# Patient Record
Sex: Female | Born: 1965 | Race: White | Hispanic: No | Marital: Married | Smoking: Never smoker
Health system: Southern US, Community
[De-identification: ages and names within clinical notes are randomized; demographics above are authoritative.]

## PROBLEM LIST (undated history)

## (undated) DIAGNOSIS — K5792 Diverticulitis of intestine, part unspecified, without perforation or abscess without bleeding: Secondary | ICD-10-CM

## (undated) DIAGNOSIS — R51 Headache: Secondary | ICD-10-CM

## (undated) DIAGNOSIS — N6459 Other signs and symptoms in breast: Secondary | ICD-10-CM

## (undated) DIAGNOSIS — Z87898 Personal history of other specified conditions: Secondary | ICD-10-CM

## (undated) DIAGNOSIS — F988 Other specified behavioral and emotional disorders with onset usually occurring in childhood and adolescence: Secondary | ICD-10-CM

## (undated) DIAGNOSIS — R519 Headache, unspecified: Secondary | ICD-10-CM

## (undated) DIAGNOSIS — T4145XA Adverse effect of unspecified anesthetic, initial encounter: Secondary | ICD-10-CM

## (undated) DIAGNOSIS — R112 Nausea with vomiting, unspecified: Secondary | ICD-10-CM

## (undated) DIAGNOSIS — J309 Allergic rhinitis, unspecified: Secondary | ICD-10-CM

## (undated) DIAGNOSIS — Z9889 Other specified postprocedural states: Secondary | ICD-10-CM

## (undated) HISTORY — DX: Headache: R51

## (undated) HISTORY — DX: Personal history of other specified conditions: Z87.898

## (undated) HISTORY — DX: Other specified behavioral and emotional disorders with onset usually occurring in childhood and adolescence: F98.8

## (undated) HISTORY — DX: Allergic rhinitis, unspecified: J30.9

## (undated) HISTORY — PX: ABDOMINAL HYSTERECTOMY: SHX81

## (undated) HISTORY — DX: Other signs and symptoms in breast: N64.59

## (undated) HISTORY — DX: Diverticulitis of intestine, part unspecified, without perforation or abscess without bleeding: K57.92

## (undated) HISTORY — DX: Headache, unspecified: R51.9

## (undated) HISTORY — PX: TONSILLECTOMY: SUR1361

---

## 1995-04-21 DIAGNOSIS — T8859XA Other complications of anesthesia, initial encounter: Secondary | ICD-10-CM

## 1995-04-21 HISTORY — DX: Other complications of anesthesia, initial encounter: T88.59XA

## 2005-03-19 ENCOUNTER — Ambulatory Visit: Payer: Self-pay

## 2006-04-01 ENCOUNTER — Ambulatory Visit: Payer: Self-pay | Admitting: Nurse Practitioner

## 2007-05-25 ENCOUNTER — Ambulatory Visit: Payer: Self-pay

## 2007-08-25 ENCOUNTER — Encounter: Payer: Self-pay | Admitting: Obstetrics & Gynecology

## 2007-08-25 ENCOUNTER — Ambulatory Visit: Payer: Self-pay | Admitting: Obstetrics & Gynecology

## 2007-08-25 DIAGNOSIS — N6459 Other signs and symptoms in breast: Secondary | ICD-10-CM

## 2007-08-25 HISTORY — DX: Other signs and symptoms in breast: N64.59

## 2007-08-26 ENCOUNTER — Ambulatory Visit: Payer: Self-pay | Admitting: Obstetrics & Gynecology

## 2007-08-29 ENCOUNTER — Ambulatory Visit: Payer: Self-pay | Admitting: Obstetrics & Gynecology

## 2007-08-30 ENCOUNTER — Ambulatory Visit: Payer: Self-pay | Admitting: Obstetrics & Gynecology

## 2007-09-01 ENCOUNTER — Ambulatory Visit: Payer: Self-pay | Admitting: Nurse Practitioner

## 2008-08-28 ENCOUNTER — Ambulatory Visit: Payer: Self-pay | Admitting: Nurse Practitioner

## 2008-08-28 ENCOUNTER — Encounter: Payer: Self-pay | Admitting: Obstetrics & Gynecology

## 2008-09-11 ENCOUNTER — Ambulatory Visit: Payer: Self-pay | Admitting: Nurse Practitioner

## 2008-09-11 ENCOUNTER — Encounter: Payer: Self-pay | Admitting: Obstetrics & Gynecology

## 2008-09-20 ENCOUNTER — Ambulatory Visit: Payer: Self-pay | Admitting: Obstetrics & Gynecology

## 2009-06-04 ENCOUNTER — Ambulatory Visit: Payer: Self-pay | Admitting: Obstetrics and Gynecology

## 2009-09-03 ENCOUNTER — Ambulatory Visit: Payer: Self-pay | Admitting: Nurse Practitioner

## 2010-09-02 ENCOUNTER — Ambulatory Visit: Payer: Self-pay | Admitting: Nurse Practitioner

## 2010-09-02 NOTE — Assessment & Plan Note (Signed)
NAME:  Dawn Spencer NO.:  [id number]   MEDICAL RECORD NO.:  [id number]          PATIENT TYPE:  POB   LOCATION:  [facility] at [facility]         FACILITY:  [facility]   PHYSICIAN:  Catalina Antigua, MD     DATE OF BIRTH:  1965/05/27   DATE OF SERVICE:  06/04/2009                                  CLINIC NOTE   The patient comes to the office today for several issues, of first is to  follow up on some lab that she had done.  She works at [employer] and  they have access to [employer] in [place] and they are allowed to have  their labs done basically for free.  She had a complete metabolic panel  showing the glucose elevated at 91.  She also had a fasting lipid, total  cholesterol was 238 and her LDL was 155.  Other negatives were in her  urine, she had +3 blood.  She is not on her menstrual cycle.  All other  labs were normal including TSH, CBC.  Second issue is that of insomnia.  The patient continues to have difficulty with sleep ongoing for almost 1  year.  She has difficulty falling asleep taking 1-2 hours after taking  the Ambien to fall asleep, she also has difficulty maintaining sleep and  wakes up about 4:30 a.m.Marland Kitchen  She denies taking any daytime nap.  She does  have a sedentary lifestyle.  She does not exercise.  She complains of  daytime fatigue.  She does snore.  She does not have hypertension.  She  does not have apnea.  Patient is doing better.  The third issue is that  of migraine headaches.  The patient is doing better on Relpax, we  switched her from Imitrex which did not seem to be helping over to  Relpax as she feels that she is in much better place with her migraine  headaches.  She is also having ongoing issues with her weight.  She is 5  feet 1, weighs 186 pounds.  Her weight has fluctuated over years.  She  did recently lose down to 174 but has put weight back on again.  She has  also had problems with her cholesterol related to weight gain and at one  point, her cholesterol was in 300s.  This patient does not have a  primary care physician.   PHYSICAL EXAM:  GENERAL:  Well-developed, well-nourished 45 year old  Caucasian female in no acute distress.  VITAL SIGNS:  Blood pressure is 116/82, pulse 74, weight 186, height 5  feet 5.  CARDIAC:  Regular rate and rhythm.  LUNGS:  Clear bilaterally.   ASSESSMENT:  1. Migraine headaches.  2. Abnormal labs.  3. Insomnia.  4. Obesity.   PLAN:  1. We will send her for a sleep study.  2. She will be referred to [place] [facility] for primary care to      include evaluation and maintenance of her cholesterol.  We will      switch her over to Ambien CR 112.5 one p.o. q.h.s. #30 with 4      refills.  We will repeat  her urine if she continues to have blood in her urine.  She will be      referred to a urologist.  She will be returning here in May for her      annual Pap smear.      [name], NP    ______________________________  Catalina Antigua, MD    LR/MEDQ  D:  06/04/2009  T:  06/05/2009  Job:  [id number]

## 2010-09-02 NOTE — Assessment & Plan Note (Signed)
NAME:  Dawn Spencer.:  [id number]   MEDICAL RECORD NO.:  [id number]          PATIENT TYPE:  POB   LOCATION:  [facility]         FACILITY:  [facility]   PHYSICIAN:  Johnella Moloney, MD        DATE OF BIRTH:  Sep 04, 1965   DATE OF SERVICE:  08/28/2008                                  CLINIC NOTE   The patient comes to the office today for her yearly Pap, pelvic and  breast exam.  The patient has had no more issues with her left breast.  Please see that note from May of 2009, but in December of 2009 she did  find a lump in her right breast.  She does have a family history of  breast cancer including grandmother and two aunts.  She was concerned  and called the office.  We did order a mammogram and an ultrasound.  She  was diagnosed with a cyst in her breast, was offered the option of  aspiration, declined that and was asked to return in March of 2010.  As  yet she has not returned but she will be returning after today's visit.  She has been seen at [facility]  to get her  yearly labs done and her cholesterol has come down from the 280s down to  the 217 range.  Her triglycerides and other labs were improved from last  year.  Since then she is currently engaged.  She is not having any other  physical problems.   VITAL SIGNS:  Blood pressure is 120/85, pulse is 65, weight 188, height  is 5 feet 1 inch.   ALLERGIES:  No known drug allergies.   PHYSICAL EXAM:  Well-developed, well-nourished 45 year old Caucasian  female in no acute distress.  HEENT:  Head is normocephalic and atraumatic.  Thyroid is not enlarged.  There were no nodules present.  CARDIAC:  Regular rate and rhythm.  LUNGS:  Clear bilaterally.  BREASTS:  Large, equal in size.  On the right upper outer quadrant there  is an approximately 2-3 cm nontender mass.  The left breast is without  any mass.  There is no dimpling.  There is no nipple discharge.  ABDOMEN:  Obese,  nontender.  GENITALIA:  Externally there are no lesions or discharges.  Internally  the vaginal vault has a scant amount of white nonodorous discharge.  The  cervix is pinpoint and difficult to obtain a specimen.  In fact Dr. Silas Flood  was called in to help.  Bimanual exam:  No cervical motion tenderness.  No adnexal mass.  EXTREMITIES:  Warm and dry.   ASSESSMENT:  Yearly Pap, pelvic and breast exam.  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  The plan for  this is we will give her samples of Vagifem 10 mcg one tablet in the  vagina nightly for 14 nights.  Following that the patient will return to  the office and we will attempt to repeat her Pap smear.  If that is not  successful she will be scheduled with a physician  to use a dilator to  get an adequate sample.   PLAN:  1. Contraception.  The patient has been on [place] birth control pills.      These have worked very well for her.  She is a nonsmoker.  She will      have that refilled one p.o. daily #28 with 11 refills.  2. Health maintenance.  The patient will have her laboratory work      faxed to this office.  3. Right breast lump.  Plan:  The patient will return for her      mammogram as she was previously directed.  She continues to not be      in favor of aspiration.  She will again return to this office in 2      weeks.      [name], NP    ______________________________  Johnella Moloney, MD    LR/MEDQ  D:  08/28/2008  T:  08/28/2008  Job:  [id number]

## 2010-09-02 NOTE — Assessment & Plan Note (Signed)
NAME:  Dawn Spencer.:  [id number]   MEDICAL RECORD NO.:  [id number]          PATIENT TYPE:  POB   LOCATION:  [facility]         FACILITY:  [facility]   PHYSICIAN:  Johnella Moloney, MD        DATE OF BIRTH:  1965-06-18   DATE OF SERVICE:                                  CLINIC NOTE   HISTORY OF PRESENT ILLNESS:  The patient comes to the office today for  her yearly Pap, pelvic and breast exam.  She has two complaints today.  The first is that of abnormal bleeding from her left nipple that  happened approximately one week ago.  She originally thought that this  was blood from shaving her legs and then realized the blood was coming  from her nipple.  She held a towel over this and it stopped bleeding  after approximately 5 minutes.  She does describe this is a bright red  blood.  There was no pain prior to this and no pain or discharge  afterwards.  She did have her last mammogram in January 2009 at [facility].  She has had one yearly with no abnormal period.  She does have a maternal grandmother who died premenopausal with breast  cancer, otherwise, no family history.  Her second complaint today is  that of vaginal discharge that has an odor.  She describes it as clear  with an odorous component approximately two to three times per week.  She is also interested today in changing her birth control pills as she  feels that they are a low dosed.  They are not keeping her from having  heavy periods.   IMMUNIZATIONS:  The patient has had rubella tetanus and flu.   MENSTRUAL HISTORY:  The patient's first day of her last menstrual period  was August 16, 2007.  She did begin her menstrual cycles age of 26.  Her  periods last between 7-8 days.  She describes her menstrual cycle as  having.  She is currently using Ortho Tri-Cyclen daily for birth  control.  She is not attempting pregnancy.   OBSTETRICAL HISTORY:  She is G3, P3.  She had three C  sections her last  pregnancy was 12 years ago.   GYNECOLOGIC HISTORY:  Her last Pap smear was July 2006.  It was not  abnormal.  She has never had an abnormal.   SURGICAL HISTORY:  She has never had a blood transfusion.  Other than C-  section, she has not had any surgery.   FAMILY HISTORY:  Dawn Spencer has had heart attack, grandmother on her  mother's side did have a cancer.   PERSONAL MEDICAL HISTORY:  None.   SOCIAL HISTORY:  The patient works outside the home.  She does not  smoke.  She is a rare social drinker.  She partakes in very few  caffeinated beverages.  She has no risky sexual behavior.  She is  currently engaged to be married and is contemplating marriage next  summer.   REVIEW OF SYSTEMS:  The patient has had a 30-pound weight gain in the  past 6  months.  Prior to that, she had lost 70 pounds on Weight Watchers  and feels this weight is returning.  She does state that when she was on  Weight Watchers, she was only eating approximately 500 calories, and she  realizes she was under eating.  She also complains of a vaginal odor  with her discharge.   PHYSICAL EXAMINATION:  VITAL SIGNS:  Blood pressure is 126/90, the  patient does state that she is quite anxious today, pulse is 77, weight  is 187.  Height is 5 feet 1 inches.  HEENT:  Head is normocephalic and atraumatic.  Thyroid is without  nodules or enlargement.  CARDIAC:  Regular rate and rhythm.  LUNGS:  Clear bilaterally.  ABDOMEN:  Soft, nontender with no organomegaly.  BREASTS:  Symmetrical.  There is no axilla lymphadenopathy.  There is no  nipple discharge or bleeding noted.  No abrasions on the nipples.  There  is no mass appreciated.  She does have some general lumpiness to her  breasts, but no specific mass or tenderness noted.  GENITALIA:  Externally, the patient has a shaved mons.  Internally, the  vaginal vault is clear of discharge.  She does not have any odor.  The  cervix is retroflexed and  retroverted somewhat difficult to get a Pap  smear.  She did have some easy bleeding.  Bimanual exam:  There was no  cervical motion tenderness.  There is no adnexal mass.   ASSESSMENT/PLAN:  1. Well-woman exam.  We will do a wet prep, GC, chlamydia as well as      Pap smear today.  2. Bleeding from her left nipple.  We will order a diagnostic      mammogram as well as a diagnostic ultrasound.  We will check her      labs for CBC, Chem 20, TSH and prolactin level.  She will return      when she is fasting for a fasting cholesterol check.  We will see      her back in one week for follow-up on her test as well as labs.      [name], NP    ______________________________  Johnella Moloney, MD    LR/MEDQ  D:  08/25/2007  T:  08/25/2007  Job:  [id number]

## 2010-09-02 NOTE — Assessment & Plan Note (Signed)
NAME:  Dawn Spencer.:  [id number]   MEDICAL RECORD NO.:  [id number]          PATIENT TYPE:  POB   LOCATION:  [facility]         FACILITY:  [facility]   PHYSICIAN:  Ginger Carne, MD DATE OF BIRTH:  05-09-1965   DATE OF SERVICE:  09/01/2007                                  CLINIC NOTE   The patient comes to the office today following up on the bleeding from  her left breast.  We did get all of her mammograms, ultrasounds and labs  back, all were normal.  She also had a DEXA scan done which was normal.  She has a mother with severe osteoporosis and she was anxious to have  that done.  She also is today interested in switching over her birth  control from the monthly to the 3 month [place].   Vital signs; blood pressure is 121/77, pulse is 70.  Weight is 188.  Height is 5 feet 1.   ASSESSMENT/PLAN:  1. Breast bleeding.  I did discuss this with Dr. Mia Creek, and he      suggested that unless she has any other bleeding problems that we      see her back for her yearly visit.  2. Contraception.  Will switch her from her birth control to      [place].  She will take 1 tablet daily.  We did spend time      discussing how this was to be taken and that she may have some      breakthrough bleeding at week 6, and she is to stop her birth      control pills for 2-3 days during that time, allow herself to have      a small period and then resume those.  3. She will return to clinic in 1 year or p.r.n. if she has any need      for this.      [name], NP    ______________________________  Ginger Carne, MD    LR/MEDQ  D:  09/01/2007  T:  09/01/2007  Job:  [id number]

## 2010-09-02 NOTE — Assessment & Plan Note (Signed)
NAME:  Dawn Spencer.:  [id number]   MEDICAL RECORD NO.:  [id number]          PATIENT TYPE:  POB   LOCATION:  [facility]         FACILITY:  [facility]   PHYSICIAN:  [name], NP   DATE OF BIRTH:  [birth year]   DATE OF SERVICE:  09/03/2009                                  CLINIC NOTE   The patient comes to office today for several issues, the first is that  to discuss her birth control pill and her irregular menses.  She has  been having some breakthrough bleeding with her Garnette Scheuermann and she wants to  switch to something stronger.  She is currently engaged to be married.  There are no other issues.  The second issue is that of weight loss  since her last office visit on June 04, 2009.  She is showing weight  watchers, started exercising, and has lost 26 pounds.  She has not gone  to [facility] to have her cholesterol except for a recheck as  she has decided that she will wait until July.  Since she is losing  weight, she has also start taking fish oil, B12, calcium, and niacin in  the hopes of bringing her cholesterol down.  She is also taking red rice  yeast.  She was unable to afford Ambien apparently, it is going to be  approximately 75 dollars per month and that was cost negative for her.  She is willing to try something different.  She still has trouble  falling asleep and staying asleep.  She did not go for her sleep study.   ALLERGIES:  No known allergies.   PHYSICAL EXAMINATION:  VITAL SIGNS:  Blood pressure is 120/78, pulse 66,  weight 120.  GENERAL:  Well-developed, well-nourished, overweight 45 year old  Caucasian female in no acute distress.  HEENT:  Head is normocephalic and atraumatic.  Pupils equal and react.  EXTREMITIES:  Warm and dry.  She does have a reddish-colored mole on her  left lower leg.   ASSESSMENT:  1. Irregular menstrual cycle.  2. Insomnia.  3. Mole on left leg.   PLAN:  The patient will be given  samples of Silenor 6 mg 1 sample pack  with prescription for #30 with 11 refills.  We will also switch her  birth control over to Levlen 1 p.o. daily #28 with 11 refills.  The  patient is also scheduled for her mammogram and referred to Dermatology.  She will follow up for her yearly physical with either this office or  [facility] office, she is free to choose.      [name], NP     LR/MEDQ  D:  09/03/2009  T:  09/04/2009  Job:  [id number]

## 2010-09-02 NOTE — Assessment & Plan Note (Signed)
NAME:  Dawn Spencer.:  [id number]   MEDICAL RECORD NO.:  [id number]          PATIENT TYPE:  POB   LOCATION:  [facility]         FACILITY:  [facility]   PHYSICIAN:  Elsie Lincoln, MD      DATE OF BIRTH:  February 09, 1966   DATE OF SERVICE:                                  CLINIC NOTE   The patient comes into the office today for an unsatisfactory Pap smear.  At her last office visit from Aug 28, 2008, we were unable to get a  specimen secondary to the size of her cervical os.  After consultation  with Dr. Silas Flood, we gave the patient Vagifem 10 mcg 1 tablet into her  vagina nightly for the next 14 nights.  The patient did return today for  that repeat Pap smear.  The patient also has several other complaints  including vaginal dryness.  She has also been having some insomnia.  She  has no difficulty falling asleep but difficulty staying asleep.  She  awakens at night between 3 and 4 o'clock.  This has been ongoing for  about 6 weeks.  She cannot relate it to anything.  She denies any signs  or symptoms of depression.  She feels overall that she is very happy in  her life.  She is also having some worsening of her migraine headache.  She has had migraines for years.  She usually takes Excedrin Migraine  and does well.  She had one very bad headache last week in which she  felt so ill that she had to vomit.   PHYSICAL EXAMINATION:  GENERAL:  Well-developed, well-nourished 45-year-  old Caucasian female in no acute distress.  VITAL SIGNS:  Blood pressure is 126/94, weight 186 pounds, height 5 feet  1 inch, pulse 65.  GENITALIA:  Externally, the labia are flat, dry, and somewhat tacky.  Internally, the vaginal vault has a thickish, white, non-odorous  discharge.  The cervix continues to be very small, although the os  appears larger than it was.  We did attempt to get a specimen, and there  was some friability.   ASSESSMENT:  1. Repeat Pap smear due to inadequate  sampling.  The plan for this is      if this Pap smear result comes back unsatisfactory specimen, she      will be referred to the MD who will have to dilate her cervix for a      sample.  2. Contraception.  The patient is currently on Micronor for      contraception.  It is possible that she is on too low of a dose of      birth control pills, and we have discussed increasing her dose.  We      will start her on Femcon Fe 1 p.o. daily #28 with 3 refills.  She      is given one month's sample.  3. Insomnia.  The patient is having difficulty maintaining sleep.  We      did discuss sleep hygiene.  She will be given Ambien 10 mg 1/2      tablet  to 1 tablet at bedtime p.r.n. sleep #20 with 2 refills.  4. Migraine.  The patient is given a prescription for Imitrex 100 mg 1      p.o. p.r.n. migraine #9 with 3 refills.  She again will be seen      back by the MD for her Pap smear.      [name], NP    ______________________________  Elsie Lincoln, MD   LR/MEDQ  D:  09/11/2008  T:  09/11/2008  Job:  [id number]

## 2010-10-07 ENCOUNTER — Ambulatory Visit (INDEPENDENT_AMBULATORY_CARE_PROVIDER_SITE_OTHER): Payer: BC Managed Care – PPO | Admitting: Nurse Practitioner

## 2010-10-07 DIAGNOSIS — G43109 Migraine with aura, not intractable, without status migrainosus: Secondary | ICD-10-CM

## 2010-10-07 DIAGNOSIS — Z3009 Encounter for other general counseling and advice on contraception: Secondary | ICD-10-CM

## 2010-10-07 DIAGNOSIS — Z01419 Encounter for gynecological examination (general) (routine) without abnormal findings: Secondary | ICD-10-CM

## 2010-10-08 NOTE — Assessment & Plan Note (Unsigned)
NAME:  Dawn Spencer, Dawn Spencer NO.:  [id number]  MEDICAL RECORD NO.:  [id number]          PATIENT TYPE:  LOCATION:  [facility]           FACILITY:  PHYSICIAN:  Jaynie Collins, MD          DATE OF BIRTH:  DATE OF SERVICE:  10/07/2010                                 CLINIC NOTE  The patient comes to office today for her yearly Pap, pelvic, and breast exam.  The patient has been given samples of a low Lo Loestrin and done well on that and would like to have a refill on that.  She has also done well with her headaches on Relpax, somewhat like to stay on that.  She has gained some weight and is interested in discussing weight loss issues that she has had years of weight gain and weight loss.  Her chart is not available to [place] today.  PHYSICAL EXAMINATION:  VITAL SIGNS:  Blood pressure is 127/88, pulse is 89, weight 176, height is 61 inches. HEENT:  Head is normocephalic and atraumatic.  Pupil equal and reactive. Thyroid is without enlargement or nodules. CARDIAC:  Regular rate and rhythm.  No murmurs, rubs, or thrills. LUNGS:  Clear bilaterally. BREASTS:  Symmetrical.  No skin dimpling.  No mass appreciated. ABDOMEN:  Obese, nontender.  No organomegaly. GENITALIA:  Externally, the mons is shaved.  Internally, there is a thin white non-odorous vaginal discharge.  The os in the cervix is quite difficult to see, unable to collect adequate specimen.  At this point Dr. Macon Large, came in to evaluate the patient. EXTREMITIES:  Warm and dry.  ASSESSMENT: 1. Yearly Pap, pelvic. 2. Contraception. 3. Migraine.  PLAN:  The patient is given refills on her low Loestrin Fe.  She is given a 71-month supply with 4 refills.  She is also given prescription for Relpax 40 mg, a 41-month supply with 4 refills.  She is also on the advice of Dr. Macon Large, given Cytotec 200 mcg to place in her vagina the night before she returns to this office for repeat Pap smear.  She will return at  her convenience and hopefully at that point, we will have found her chart and can review her past medical history.    [name], NP   ______________________________ Jaynie Collins, MD  LR/MEDQ  D:  10/07/2010  T:  10/08/2010  Job:  [id number]

## 2010-10-28 ENCOUNTER — Ambulatory Visit: Payer: BC Managed Care – PPO | Admitting: Nurse Practitioner

## 2011-01-29 DIAGNOSIS — N6459 Other signs and symptoms in breast: Secondary | ICD-10-CM | POA: Insufficient documentation

## 2011-02-02 ENCOUNTER — Ambulatory Visit: Payer: BC Managed Care – PPO | Admitting: Obstetrics & Gynecology

## 2011-02-02 DIAGNOSIS — N920 Excessive and frequent menstruation with regular cycle: Secondary | ICD-10-CM

## 2011-06-02 ENCOUNTER — Encounter: Payer: Self-pay | Admitting: Nurse Practitioner

## 2011-06-02 ENCOUNTER — Ambulatory Visit (INDEPENDENT_AMBULATORY_CARE_PROVIDER_SITE_OTHER): Payer: BC Managed Care – PPO | Admitting: Nurse Practitioner

## 2011-06-02 DIAGNOSIS — E559 Vitamin D deficiency, unspecified: Secondary | ICD-10-CM

## 2011-06-02 DIAGNOSIS — Z1272 Encounter for screening for malignant neoplasm of vagina: Secondary | ICD-10-CM

## 2011-06-02 DIAGNOSIS — Z23 Encounter for immunization: Secondary | ICD-10-CM

## 2011-06-02 DIAGNOSIS — G43009 Migraine without aura, not intractable, without status migrainosus: Secondary | ICD-10-CM

## 2011-06-02 DIAGNOSIS — G4700 Insomnia, unspecified: Secondary | ICD-10-CM

## 2011-06-02 DIAGNOSIS — Z01419 Encounter for gynecological examination (general) (routine) without abnormal findings: Secondary | ICD-10-CM

## 2011-06-02 DIAGNOSIS — G43909 Migraine, unspecified, not intractable, without status migrainosus: Secondary | ICD-10-CM

## 2011-06-02 DIAGNOSIS — Z309 Encounter for contraceptive management, unspecified: Secondary | ICD-10-CM | POA: Insufficient documentation

## 2011-06-02 MED ORDER — TETANUS-DIPHTH-ACELL PERTUSSIS 5-2.5-18.5 LF-MCG/0.5 IM SUSP: 0.5000 mL | Freq: Once | INTRAMUSCULAR | Status: DC

## 2011-06-02 MED ORDER — CALCIUM CARBONATE-VITAMIN D 500-200 MG-UNIT PO TABS: 1.0000 | ORAL_TABLET | Freq: Two times a day (BID) | ORAL | Status: DC

## 2011-06-02 MED ORDER — ELETRIPTAN HYDROBROMIDE 40 MG PO TABS: 40.0000 mg | ORAL_TABLET | ORAL | Status: DC | PRN

## 2011-06-02 MED ORDER — ZOLPIDEM TARTRATE 10 MG PO TABS: 10.0000 mg | ORAL_TABLET | Freq: Every evening | ORAL | Status: DC | PRN

## 2011-06-02 NOTE — Patient Instructions (Signed)

## 2011-06-02 NOTE — Progress Notes (Signed)
Subjective:     Patient ID: Dawn Spencer, female   DOB: 04/07/1966, 46 y.o.   MRN: [id number]  HPI  In the office today for yearly exam. Last pap was 3 years ago and there is an issue with her stenotic cervix. She has been given cytotec in past. Her paps have been negative. She has been having some trouble with her low dose BCP Lo Loestrin FE and break through bleeding. She would like to be on a higher dose pill. She does have migraine without aura and need refill on Relpax. She is also having some issues with insomnia related to husband losing his job and his health concerns. She has lost 44 lbs on high protein diet   Review of Systems  Constitutional: Negative.  Negative for unexpected weight change.       Has lost 44 lbs on high protein diet  HENT: Negative.   Eyes: Negative.   Respiratory: Negative.   Cardiovascular: Negative.   Gastrointestinal: Negative.   Genitourinary: Negative.   Musculoskeletal: Negative.   Neurological:       Has migraine without aura  Psychiatric/Behavioral:       Has insomnia  All other systems reviewed and are negative.       Objective:   Physical Exam  Constitutional: She appears well-developed and well-nourished.  HENT:  Head: Normocephalic and atraumatic.  Eyes: Pupils are equal, round, and reactive to light.  Neck: Normal range of motion. No thyromegaly present.  Cardiovascular: Normal rate, regular rhythm and normal heart sounds.   Pulmonary/Chest: Effort normal.  Abdominal: Soft.  Genitourinary: Vagina normal and uterus normal. No vaginal discharge found.       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 She has normal strength. She displays a negative Romberg sign.  Skin: Skin is warm, dry and intact. No abrasion and no bruising noted.       Assessment:    Yearly Exam and Contraception Migraine without Aura Insomnia    Plan:     Pap Smear done today. If results return without endocervical cells will give cytotec and  repeat.  Refill meds today for migraine and insomnia Requests 3 month supply of generic BCP. Will give Norinyl 1/35 daily

## 2011-09-07 ENCOUNTER — Telehealth: Payer: Self-pay | Admitting: *Deleted

## 2011-09-07 DIAGNOSIS — Z309 Encounter for contraceptive management, unspecified: Secondary | ICD-10-CM

## 2011-09-07 MED ORDER — NORGESTIMATE-ETH ESTRADIOL 0.25-35 MG-MCG PO TABS: 1.0000 | ORAL_TABLET | Freq: Every day | ORAL | Status: DC

## 2011-09-07 NOTE — Telephone Encounter (Signed)
Patient would like to try a different ocp due to breakthrough bleeding.  She is otherwise doing well.  I have called in  orthocyclen for her to see if this will take care of her problem.

## 2011-11-13 ENCOUNTER — Telehealth: Payer: Self-pay | Admitting: *Deleted

## 2011-11-13 DIAGNOSIS — Z309 Encounter for contraceptive management, unspecified: Secondary | ICD-10-CM

## 2011-11-13 MED ORDER — NORGESTIMATE-ETH ESTRADIOL 0.25-35 MG-MCG PO TABS: 1.0000 | ORAL_TABLET | Freq: Every day | ORAL | Status: DC

## 2011-11-13 NOTE — Telephone Encounter (Signed)
Patient needs refills of her ocp called to [facility] pharmacy.

## 2013-01-18 ENCOUNTER — Telehealth: Payer: Self-pay | Admitting: *Deleted

## 2013-01-18 NOTE — Telephone Encounter (Signed)
Pharmacy called for refill of previfem.  One refill given and patient notified it is time for appointment.

## 2013-02-20 ENCOUNTER — Emergency Department: Payer: Self-pay | Admitting: Emergency Medicine

## 2013-02-20 LAB — COMPREHENSIVE METABOLIC PANEL
Albumin: 3.3 g/dL — ABNORMAL LOW (ref 3.4–5.0)
BUN: 9 mg/dL (ref 7–18)
Calcium, Total: 9.5 mg/dL (ref 8.5–10.1)
Chloride: 104 mmol/L (ref 98–107)
Co2: 28 mmol/L (ref 21–32)
EGFR (Non-African Amer.): >60
Glucose: 93 mg/dL (ref 65–99)
Potassium: 3.3 mmol/L — ABNORMAL LOW (ref 3.5–5.1)
SGPT (ALT): 22 U/L (ref 12–78)
Total Protein: 7.4 g/dL (ref 6.4–8.2)

## 2013-02-20 LAB — URINALYSIS, COMPLETE
Bilirubin,UR: NEGATIVE
Glucose,UR: NEGATIVE mg/dL (ref 0–75)
Nitrite: NEGATIVE
Ph: 5 (ref 4.5–8.0)
Specific Gravity: 1.023 (ref 1.003–1.030)
Squamous Epithelial: 10

## 2013-02-20 LAB — CBC
HCT: 37.4 % (ref 35.0–47.0)
MCH: 30.5 pg (ref 26.0–34.0)
MCHC: 33.6 g/dL (ref 32.0–36.0)
Platelet: 244 10*3/uL (ref 150–440)
RBC: 4.11 10*6/uL (ref 3.80–5.20)
RDW: 14.4 % (ref 11.5–14.5)

## 2013-02-21 ENCOUNTER — Ambulatory Visit: Payer: BC Managed Care – PPO | Admitting: Nurse Practitioner

## 2013-03-01 ENCOUNTER — Ambulatory Visit: Payer: BC Managed Care – PPO | Admitting: Obstetrics and Gynecology

## 2013-03-14 ENCOUNTER — Encounter: Payer: Self-pay | Admitting: Nurse Practitioner

## 2013-03-14 ENCOUNTER — Ambulatory Visit (INDEPENDENT_AMBULATORY_CARE_PROVIDER_SITE_OTHER): Payer: BC Managed Care – PPO | Admitting: Nurse Practitioner

## 2013-03-14 VITALS — BP 130/99 | HR 75 | Ht 60.0 in | Wt 178.0 lb

## 2013-03-14 DIAGNOSIS — Z01419 Encounter for gynecological examination (general) (routine) without abnormal findings: Secondary | ICD-10-CM

## 2013-03-14 DIAGNOSIS — Z309 Encounter for contraceptive management, unspecified: Secondary | ICD-10-CM

## 2013-03-14 DIAGNOSIS — Z1151 Encounter for screening for human papillomavirus (HPV): Secondary | ICD-10-CM

## 2013-03-14 DIAGNOSIS — Z124 Encounter for screening for malignant neoplasm of cervix: Secondary | ICD-10-CM

## 2013-03-14 DIAGNOSIS — N926 Irregular menstruation, unspecified: Secondary | ICD-10-CM

## 2013-03-14 DIAGNOSIS — R110 Nausea: Secondary | ICD-10-CM

## 2013-03-14 DIAGNOSIS — G4700 Insomnia, unspecified: Secondary | ICD-10-CM

## 2013-03-14 DIAGNOSIS — N939 Abnormal uterine and vaginal bleeding, unspecified: Secondary | ICD-10-CM

## 2013-03-14 DIAGNOSIS — G43909 Migraine, unspecified, not intractable, without status migrainosus: Secondary | ICD-10-CM

## 2013-03-14 MED ORDER — NORGESTIMATE-ETH ESTRADIOL 0.25-35 MG-MCG PO TABS: 1.0000 | ORAL_TABLET | Freq: Every day | ORAL | Status: DC

## 2013-03-14 MED ORDER — ZOLPIDEM TARTRATE 10 MG PO TABS: 10.0000 mg | ORAL_TABLET | Freq: Every evening | ORAL | Status: DC | PRN

## 2013-03-14 MED ORDER — ONDANSETRON 8 MG PO TBDP: 8.0000 mg | ORAL_TABLET | Freq: Three times a day (TID) | ORAL | Status: DC | PRN

## 2013-03-14 MED ORDER — ELETRIPTAN HYDROBROMIDE 40 MG PO TABS: 40.0000 mg | ORAL_TABLET | ORAL | Status: DC | PRN

## 2013-03-14 NOTE — Progress Notes (Signed)
History:  Dawn Spencer is a 47 y.o. G3P3 who presents to [facility] clinic today for annual exam and refill of medications. She continues to have break through bleeding with birth control pills. She had an episode of severe pain about one month ago and went to ER in [place]. She was told she had a simple cyst on each ovary and diverticulitis. She was then sent to [facility] and they put her on provera for 20 days. In addition several antibiotics and did abdominal and pelvic ultrasounds. She was told these ultrasounds would not be read until mid December. Her husband has had a vasectomy and she has used BCPs for bleeding control only. She has a history of cervical stenosis. All of her pap smears have been negative.She has weight fluctuations with different diets. She also has a history of migraines and needs medications refilled.   The following portions of the patient's history were reviewed and updated as appropriate: allergies, current medications, past family history, past medical history, past social history, past surgical history and problem list.  Review of Systems:  Pertinent items are noted in HPI.  Objective:  Physical Exam BP 130/99  Pulse 75  Ht 5' (1.524 m)  Wt 178 lb (80.74 kg)  BMI 34.76 kg/m2  LMP 03/08/2013 GENERAL: Well-developed, well-nourished female in no acute distress.  HEENT: Normocephalic, atraumatic.  NECK: Supple. Normal thyroid.  LUNGS: Normal rate. Clear to auscultation bilaterally.  HEART: Regular rate and rhythm with no adventitious sounds.  BREASTS: Symmetric in size. No masses, skin changes, nipple drainage, or lymphadenopathy. ABDOMEN: Soft, nontender, nondistended. No organomegaly. Normal bowel sounds appreciated in all quadrants.  Old well healed C/S scars PELVIC: Normal external female genitalia. Vagina is pink and rugated.  Normal discharge. Normal cervix contour/ with stenosis Pap smear obtained. Uterus is ? 8 week size. . No adnexal  mass or tenderness.  EXTREMITIES: No cyanosis, clubbing, or edema, 2+ distal pulses.   Labs and Imaging No results found.  Assessment & Plan:  Assessment:  Annual Exam Abnormal Uterine Bleeding Migraines  Plans: Pap smear obtained Will review labs, notes from ER visit and [facility] visit/ there is no formal U/S reading Transvaginal U/S non OB at [facility] Following U/S will schedule for endometrial Biopsy and Novasure  Refill medication Ambien, Zofran, Relpax  Carolynn Serve, NP 03/14/2013 4:45 PM

## 2013-03-23 ENCOUNTER — Ambulatory Visit (HOSPITAL_COMMUNITY)
Admission: RE | Admit: 2013-03-23 | Discharge: 2013-03-23 | Disposition: A | Discharge status: Home / Self Care | Payer: BC Managed Care – PPO | Source: Ambulatory Visit | Attending: Nurse Practitioner | Admitting: Nurse Practitioner

## 2013-03-23 DIAGNOSIS — N949 Unspecified condition associated with female genital organs and menstrual cycle: Secondary | ICD-10-CM | POA: Insufficient documentation

## 2013-03-23 DIAGNOSIS — N939 Abnormal uterine and vaginal bleeding, unspecified: Secondary | ICD-10-CM

## 2013-03-23 DIAGNOSIS — N938 Other specified abnormal uterine and vaginal bleeding: Secondary | ICD-10-CM | POA: Insufficient documentation

## 2013-03-23 DIAGNOSIS — D259 Leiomyoma of uterus, unspecified: Secondary | ICD-10-CM | POA: Insufficient documentation

## 2013-03-23 DIAGNOSIS — N925 Other specified irregular menstruation: Secondary | ICD-10-CM | POA: Insufficient documentation

## 2013-06-27 ENCOUNTER — Ambulatory Visit (INDEPENDENT_AMBULATORY_CARE_PROVIDER_SITE_OTHER): Payer: BC Managed Care – PPO | Admitting: Nurse Practitioner

## 2013-06-27 ENCOUNTER — Encounter: Payer: Self-pay | Admitting: Nurse Practitioner

## 2013-06-27 VITALS — BP 116/85 | HR 93 | Ht 64.0 in | Wt 186.0 lb

## 2013-06-27 DIAGNOSIS — G43919 Migraine, unspecified, intractable, without status migrainosus: Secondary | ICD-10-CM

## 2013-06-27 DIAGNOSIS — G43911 Migraine, unspecified, intractable, with status migrainosus: Secondary | ICD-10-CM | POA: Insufficient documentation

## 2013-06-27 DIAGNOSIS — G43901 Migraine, unspecified, not intractable, with status migrainosus: Secondary | ICD-10-CM

## 2013-06-27 MED ORDER — PROMETHAZINE HCL 25 MG/ML IJ SOLN
25.0000 mg | Freq: Once | INTRAMUSCULAR | Status: AC
  Administered 2013-06-27: 25 mg via INTRAMUSCULAR

## 2013-06-27 MED ORDER — OXYCODONE-ACETAMINOPHEN 5-325 MG PO TABS: 2.0000 | ORAL_TABLET | ORAL | Status: DC | PRN

## 2013-06-27 MED ORDER — PROMETHAZINE HCL 25 MG PO TABS: 25.0000 mg | ORAL_TABLET | Freq: Four times a day (QID) | ORAL | Status: DC | PRN

## 2013-06-27 MED ORDER — KETOROLAC TROMETHAMINE 60 MG/2ML IM SOLN
60.0000 mg | Freq: Once | INTRAMUSCULAR | Status: AC
  Administered 2013-06-27: 60 mg via INTRAMUSCULAR

## 2013-06-27 NOTE — Patient Instructions (Signed)
Migraine Headache A migraine headache is an intense, throbbing pain on one or both sides of your head. A migraine can last for 30 minutes to several hours. CAUSES  The exact cause of a migraine headache is not always known. However, a migraine may be caused when nerves in the brain become irritated and release chemicals that cause inflammation. This causes pain. Certain things may also trigger migraines, such as:  Alcohol.  Smoking.  Stress.  Menstruation.  Aged cheeses.  Foods or drinks that contain nitrates, glutamate, aspartame, or tyramine.  Lack of sleep.  Chocolate.  Caffeine.  Hunger.  Physical exertion.  Fatigue.  Medicines used to treat chest pain (nitroglycerine), birth control pills, estrogen, and some blood pressure medicines. SIGNS AND SYMPTOMS  Pain on one or both sides of your head.  Pulsating or throbbing pain.  Severe pain that prevents daily activities.  Pain that is aggravated by any physical activity.  Nausea, vomiting, or both.  Dizziness.  Pain with exposure to bright lights, loud noises, or activity.  General sensitivity to bright lights, loud noises, or smells. Before you get a migraine, you may get warning signs that a migraine is coming (aura). An aura may include:  Seeing flashing lights.  Seeing bright spots, halos, or zig-zag lines.  Having tunnel vision or blurred vision.  Having feelings of numbness or tingling.  Having trouble talking.  Having muscle weakness. DIAGNOSIS  A migraine headache is often diagnosed based on:  Symptoms.  Physical exam.  A CT scan or MRI of your head. These imaging tests cannot diagnose migraines, but they can help rule out other causes of headaches. TREATMENT Medicines may be given for pain and nausea. Medicines can also be given to help prevent recurrent migraines.  HOME CARE INSTRUCTIONS  Only take over-the-counter or prescription medicines for pain or discomfort as directed by your  health care provider. The use of long-term narcotics is not recommended.  Lie down in a dark, quiet room when you have a migraine.  Keep a journal to find out what may trigger your migraine headaches. For example, write down:  What you eat and drink.  How much sleep you get.  Any change to your diet or medicines.  Limit alcohol consumption.  Quit smoking if you smoke.  Get 7 9 hours of sleep, or as recommended by your health care provider.  Limit stress.  Keep lights dim if bright lights bother you and make your migraines worse. SEEK IMMEDIATE MEDICAL CARE IF:   Your migraine becomes severe.  You have a fever.  You have a stiff neck.  You have vision loss.  You have muscular weakness or loss of muscle control.  You start losing your balance or have trouble walking.  You feel faint or pass out.  You have severe symptoms that are different from your first symptoms. MAKE SURE YOU:   Understand these instructions.  Will watch your condition.  Will get help right away if you are not doing well or get worse. Document Released: 04/06/2005 Document Revised: 01/25/2013 Document Reviewed: 12/12/2012 Exit[employer] Patient Information 2014 [employer].  

## 2013-06-27 NOTE — Progress Notes (Signed)
History:  Dawn Spencer is a 48 y.o. G3P3 who presents to [facility] clinic today for acute migraine that has been ongoing for one week. She has take many relpax, Ibuprofen and zofran which have not been helping. Today she is vomiting and unable to hold down any medications. She is not ill in any other fashion. Denies any fever or other issues.   The following portions of the patient's history were reviewed and updated as appropriate: allergies, current medications, past family history, past medical history, past social history, past surgical history and problem list.  Review of Systems:  Pertinent items are noted in HPI.  Objective:  Physical Exam BP 116/85  Pulse 93  Ht 5\' 4"  (1.626 m)  Wt 186 lb (84.369 kg)  BMI 31.91 kg/m2 GENERAL: Well-developed, well-nourished female in no acute distress.  HEENT: Normocephalic, atraumatic.  NECK: Supple. Normal thyroid.  LUNGS: Normal rate. Clear to auscultation bilaterally.  EXTREMITIES: No cyanosis, clubbing, or edema, 2+ distal pulses.  Trigger Point Injections  Procedure: 2cc lidocaine, 2 cc marcaine, 1 cc dexamethazone. Injected with 1cc each site into left temple. Pt tolerated procedure well.   Labs and Imaging No results found.  Assessment & Plan:  Assessment:  A: Intractable migraine  Plans:  She was given trigger point injections, toradol 60 mg and phenergan 25 mg. After she was feeling better and given percocet and phenergan to take at home. She will return to office when she is feeling better and we will discuss prevention  Olegario Messier, NP 06/27/2013 4:47 PM

## 2013-06-28 ENCOUNTER — Ambulatory Visit (INDEPENDENT_AMBULATORY_CARE_PROVIDER_SITE_OTHER): Payer: BC Managed Care – PPO | Admitting: *Deleted

## 2013-06-28 ENCOUNTER — Encounter: Payer: Self-pay | Admitting: *Deleted

## 2013-06-28 DIAGNOSIS — G43909 Migraine, unspecified, not intractable, without status migrainosus: Secondary | ICD-10-CM

## 2013-06-28 MED ORDER — SUMATRIPTAN SUCCINATE 6 MG/0.5ML ~~LOC~~ SOLN
6.0000 mg | Freq: Once | SUBCUTANEOUS | Status: AC
  Administered 2013-06-28: 6 mg via SUBCUTANEOUS

## 2013-06-28 NOTE — Progress Notes (Signed)
Patient is here today for an imitrex injection.  She is feeling a little better than yesterday, still nauseated but not vomiting.  Dawn Spencer advised her to return today for an imitrex injection.  She has tolerated this well and will keep in touch to let us know how she is feeling.

## 2013-07-04 ENCOUNTER — Encounter: Payer: BC Managed Care – PPO | Admitting: Nurse Practitioner

## 2013-07-24 ENCOUNTER — Telehealth: Payer: Self-pay | Admitting: *Deleted

## 2013-07-24 DIAGNOSIS — Z1231 Encounter for screening mammogram for malignant neoplasm of breast: Secondary | ICD-10-CM

## 2013-07-24 NOTE — Telephone Encounter (Signed)
Patient needs order for mammogram placed.  She would like to have it at [place]

## 2013-08-01 ENCOUNTER — Ambulatory Visit (INDEPENDENT_AMBULATORY_CARE_PROVIDER_SITE_OTHER): Payer: BC Managed Care – PPO | Admitting: Nurse Practitioner

## 2013-08-01 ENCOUNTER — Encounter: Payer: Self-pay | Admitting: Nurse Practitioner

## 2013-08-01 VITALS — BP 116/83 | HR 93 | Ht 60.0 in | Wt 187.0 lb

## 2013-08-01 DIAGNOSIS — G43009 Migraine without aura, not intractable, without status migrainosus: Secondary | ICD-10-CM

## 2013-08-01 DIAGNOSIS — N926 Irregular menstruation, unspecified: Secondary | ICD-10-CM

## 2013-08-01 DIAGNOSIS — N920 Excessive and frequent menstruation with regular cycle: Secondary | ICD-10-CM | POA: Insufficient documentation

## 2013-08-01 MED ORDER — TOPIRAMATE 25 MG PO TABS: 25.0000 mg | ORAL_TABLET | Freq: Three times a day (TID) | ORAL | Status: DC

## 2013-08-01 NOTE — Progress Notes (Signed)
Here today for headache follow-up.

## 2013-08-01 NOTE — Patient Instructions (Signed)
Migraine Headache A migraine headache is an intense, throbbing pain on one or both sides of your head. A migraine can last for 30 minutes to several hours. CAUSES  The exact cause of a migraine headache is not always known. However, a migraine may be caused when nerves in the brain become irritated and release chemicals that cause inflammation. This causes pain. Certain things may also trigger migraines, such as:  Alcohol.  Smoking.  Stress.  Menstruation.  Aged cheeses.  Foods or drinks that contain nitrates, glutamate, aspartame, or tyramine.  Lack of sleep.  Chocolate.  Caffeine.  Hunger.  Physical exertion.  Fatigue.  Medicines used to treat chest pain (nitroglycerine), birth control pills, estrogen, and some blood pressure medicines. SIGNS AND SYMPTOMS  Pain on one or both sides of your head.  Pulsating or throbbing pain.  Severe pain that prevents daily activities.  Pain that is aggravated by any physical activity.  Nausea, vomiting, or both.  Dizziness.  Pain with exposure to bright lights, loud noises, or activity.  General sensitivity to bright lights, loud noises, or smells. Before you get a migraine, you may get warning signs that a migraine is coming (aura). An aura may include:  Seeing flashing lights.  Seeing bright spots, halos, or zig-zag lines.  Having tunnel vision or blurred vision.  Having feelings of numbness or tingling.  Having trouble talking.  Having muscle weakness. DIAGNOSIS  A migraine headache is often diagnosed based on:  Symptoms.  Physical exam.  A CT scan or MRI of your head. These imaging tests cannot diagnose migraines, but they can help rule out other causes of headaches. TREATMENT Medicines may be given for pain and nausea. Medicines can also be given to help prevent recurrent migraines.  HOME CARE INSTRUCTIONS  Only take over-the-counter or prescription medicines for pain or discomfort as directed by your  health care provider. The use of long-term narcotics is not recommended.  Lie down in a dark, quiet room when you have a migraine.  Keep a journal to find out what may trigger your migraine headaches. For example, write down:  What you eat and drink.  How much sleep you get.  Any change to your diet or medicines.  Limit alcohol consumption.  Quit smoking if you smoke.  Get 7 9 hours of sleep, or as recommended by your health care provider.  Limit stress.  Keep lights dim if bright lights bother you and make your migraines worse. SEEK IMMEDIATE MEDICAL CARE IF:   Your migraine becomes severe.  You have a fever.  You have a stiff neck.  You have vision loss.  You have muscular weakness or loss of muscle control.  You start losing your balance or have trouble walking.  You feel faint or pass out.  You have severe symptoms that are different from your first symptoms. MAKE SURE YOU:   Understand these instructions.  Will watch your condition.  Will get help right away if you are not doing well or get worse. Document Released: 04/06/2005 Document Revised: 01/25/2013 Document Reviewed: 12/12/2012 Exit[employer] Patient Information 2014 [employer].  

## 2013-08-01 NOTE — Progress Notes (Signed)
History:  Dawn Spencer is a 48 y.o. G3P3 who presents to [facility] clinic today for evaluation of headaches and discuss prevention. Currently she has 4 severe 4 moderate and daily mild's. She believes its related to hormonal unbalance at 48 years old. She remains on BCPs and her last 2 periods have been normal. Prior to that she was having very heavy menses that were lasting 10 days. We did pelvic ultrasound and found a small fibroid. She has a friend who is using Topamax and she would like to trial that medication. She had one kidney stone more than 20 years ago and is aware that Topamax can be a factor in forming stones. She is also requesting MRI of brain, she is quite concerned about frequency and intensity of her migraines.   The following portions of the patient's history were reviewed and updated as appropriate: allergies, current medications, past family history, past medical history, past social history, past surgical history and problem list.  Review of Systems:  Pertinent items are noted in HPI.  Objective:  Physical Exam BP 116/83  Pulse 93  Ht 5' (1.524 m)  Wt 187 lb (84.823 kg)  BMI 36.52 kg/m2  LMP 07/26/2013 GENERAL: Well-developed, well-nourished female in no acute distress.  HEENT: Normocephalic, atraumatic.  NECK: Supple. Normal thyroid.  LUNGS: Normal rate. Clear to auscultation bilaterally.  HEART: Regular rate and rhythm with no adventitious sounds.  EXTREMITIES: No cyanosis, clubbing, or edema, 2+ distal pulses.   Labs and Imaging No results found.  Assessment & Plan:  Assessment:  A: Chronic migraine Irregular menses  Plans:  Will check [place] and schedule for MRI of brain Start Topamax at 25 mg for one week, 50 mg x 1 week then up to 75 mg Advised increasing fluids and avoiding heavy sweating Follow up in 6 weeks or sooner  Olegario Messier, NP 08/01/2013 10:55 AM

## 2013-08-02 LAB — FOLLICLE STIMULATING HORMONE: FSH: 20.5 m[IU]/mL

## 2013-09-12 ENCOUNTER — Encounter: Payer: BC Managed Care – PPO | Admitting: Nurse Practitioner

## 2013-09-26 ENCOUNTER — Encounter: Payer: Self-pay | Admitting: Nurse Practitioner

## 2013-09-26 ENCOUNTER — Ambulatory Visit (INDEPENDENT_AMBULATORY_CARE_PROVIDER_SITE_OTHER): Payer: BC Managed Care – PPO | Admitting: Nurse Practitioner

## 2013-09-26 VITALS — BP 133/88 | HR 64 | Ht 60.0 in | Wt 179.0 lb

## 2013-09-26 DIAGNOSIS — G43009 Migraine without aura, not intractable, without status migrainosus: Secondary | ICD-10-CM

## 2013-09-26 DIAGNOSIS — N926 Irregular menstruation, unspecified: Secondary | ICD-10-CM

## 2013-09-26 MED ORDER — TOPIRAMATE ER 25 MG PO CAP24: 25.0000 mg | ORAL_CAPSULE | ORAL | Status: DC

## 2013-09-26 MED ORDER — TOPIRAMATE ER 25 MG PO CAP24: 25.0000 mg | ORAL_CAPSULE | Freq: Two times a day (BID) | ORAL | Status: DC

## 2013-09-26 NOTE — Progress Notes (Signed)
History:  Dawn Spencer is a 48 y.o. G3P3 who presents to [facility] clinic today for two issues, first is migraine management. The Topamax is causing side effects including tingling in her fingers. She has backed the dose down to 25 mg. The second issue is of heavy menstrual bleeding and cramping. She had on 2 pads and bled through both and the blood flowed onto the floor.  She had an ultrasound which only showed small fibroid.   The following portions of the patient's history were reviewed and updated as appropriate: allergies, current medications, past family history, past medical history, past social history, past surgical history and problem list.  Review of Systems:  Pertinent items are noted in HPI.  Objective:  Physical Exam BP 133/88  Pulse 64  Ht 5' (1.524 m)  Wt 179 lb (81.194 kg)  BMI 34.96 kg/m2  LMP 09/12/2013 GENERAL: Well-developed, well-nourished female in no acute distress. Obese HEENT: Normocephalic, atraumatic.  NECK: Supple. Normal thyroid.  LUNGS: Normal rate. Clear to auscultation bilaterally.  HEART: Regular rate and rhythm with no adventitious sounds.   EXTREMITIES: No cyanosis, clubbing, or edema, 2+ distal pulses.   Labs and Imaging No results found.  Assessment & Plan:  Assessment:  Migraine DUB  Plans: Will switch to Trokendi and hope side effect profile is improved Make an appointment with MD for endometrial biopsy and NovaSure Schedule for MRI brain that was not done past visit  Olegario Messier, NP 09/26/2013 3:00 PM

## 2013-09-26 NOTE — Addendum Note (Signed)
Addended by: Olegario Messier on: 09/26/2013 04:21 PM   Modules accepted: Orders

## 2013-09-26 NOTE — Patient Instructions (Signed)
Migraine Headache A migraine headache is an intense, throbbing pain on one or both sides of your head. A migraine can last for 30 minutes to several hours. CAUSES  The exact cause of a migraine headache is not always known. However, a migraine may be caused when nerves in the brain become irritated and release chemicals that cause inflammation. This causes pain. Certain things may also trigger migraines, such as:  Alcohol.  Smoking.  Stress.  Menstruation.  Aged cheeses.  Foods or drinks that contain nitrates, glutamate, aspartame, or tyramine.  Lack of sleep.  Chocolate.  Caffeine.  Hunger.  Physical exertion.  Fatigue.  Medicines used to treat chest pain (nitroglycerine), birth control pills, estrogen, and some blood pressure medicines. SIGNS AND SYMPTOMS  Pain on one or both sides of your head.  Pulsating or throbbing pain.  Severe pain that prevents daily activities.  Pain that is aggravated by any physical activity.  Nausea, vomiting, or both.  Dizziness.  Pain with exposure to bright lights, loud noises, or activity.  General sensitivity to bright lights, loud noises, or smells. Before you get a migraine, you may get warning signs that a migraine is coming (aura). An aura may include:  Seeing flashing lights.  Seeing bright spots, halos, or zig-zag lines.  Having tunnel vision or blurred vision.  Having feelings of numbness or tingling.  Having trouble talking.  Having muscle weakness. DIAGNOSIS  A migraine headache is often diagnosed based on:  Symptoms.  Physical exam.  A CT scan or MRI of your head. These imaging tests cannot diagnose migraines, but they can help rule out other causes of headaches. TREATMENT Medicines may be given for pain and nausea. Medicines can also be given to help prevent recurrent migraines.  HOME CARE INSTRUCTIONS  Only take over-the-counter or prescription medicines for pain or discomfort as directed by your  health care provider. The use of long-term narcotics is not recommended.  Lie down in a dark, quiet room when you have a migraine.  Keep a journal to find out what may trigger your migraine headaches. For example, write down:  What you eat and drink.  How much sleep you get.  Any change to your diet or medicines.  Limit alcohol consumption.  Quit smoking if you smoke.  Get 7 9 hours of sleep, or as recommended by your health care provider.  Limit stress.  Keep lights dim if bright lights bother you and make your migraines worse. SEEK IMMEDIATE MEDICAL CARE IF:   Your migraine becomes severe.  You have a fever.  You have a stiff neck.  You have vision loss.  You have muscular weakness or loss of muscle control.  You start losing your balance or have trouble walking.  You feel faint or pass out.  You have severe symptoms that are different from your first symptoms. MAKE SURE YOU:   Understand these instructions.  Will watch your condition.  Will get help right away if you are not doing well or get worse. Document Released: 04/06/2005 Document Revised: 01/25/2013 Document Reviewed: 12/12/2012 Exit[employer] Patient Information 2014 [employer].  

## 2013-10-03 ENCOUNTER — Encounter: Payer: Self-pay | Admitting: Family Medicine

## 2013-10-03 ENCOUNTER — Ambulatory Visit (INDEPENDENT_AMBULATORY_CARE_PROVIDER_SITE_OTHER): Payer: BC Managed Care – PPO | Admitting: Family Medicine

## 2013-10-03 VITALS — BP 137/84 | HR 58 | Ht 60.0 in | Wt 179.0 lb

## 2013-10-03 DIAGNOSIS — N882 Stricture and stenosis of cervix uteri: Secondary | ICD-10-CM

## 2013-10-03 DIAGNOSIS — Z01812 Encounter for preprocedural laboratory examination: Secondary | ICD-10-CM

## 2013-10-03 DIAGNOSIS — N920 Excessive and frequent menstruation with regular cycle: Secondary | ICD-10-CM

## 2013-10-03 LAB — POCT URINE PREGNANCY: Preg Test, Ur: NEGATIVE

## 2013-10-03 MED ORDER — MISOPROSTOL 200 MCG PO TABS: 800.0000 ug | ORAL_TABLET | Freq: Two times a day (BID) | ORAL | Status: DC

## 2013-10-03 NOTE — Assessment & Plan Note (Addendum)
Attempt at EMB unsuccessful today--will return after Cytotec for re-trial. Pt. Interested in endometrial ablation if able to follow that. Check TSH as well.

## 2013-10-03 NOTE — Progress Notes (Addendum)
    Subjective:    Patient ID: Dawn Spencer is a 48 y.o. female presenting with endometrial biopsy  on 10/03/2013  HPI: Saw Monna Fam, NP for headaches ans heavy vaginal bleeding.  Here for EMB.  Essentially normal pelvic sono in 11/14.  Patient has h/o stenotic cervix requiring medicaiton prior to pap smears in the past.  Review of Systems  Constitutional: Negative for fever and chills.  Respiratory: Negative for shortness of breath.   Cardiovascular: Negative for chest pain.  Gastrointestinal: Negative for nausea, vomiting and abdominal pain.  Genitourinary: Negative for dysuria.  Skin: Negative for rash.      Objective:    BP 137/84  Pulse 58  Ht 5' (1.524 m)  Wt 179 lb (81.194 kg)  BMI 34.96 kg/m2  LMP 09/12/2013 Physical Exam  Constitutional: She is oriented to person, place, and time. She appears well-developed and well-nourished. No distress.  HENT:  Head: Normocephalic and atraumatic.  Eyes: No scleral icterus.  Neck: Neck supple.  Cardiovascular: Normal rate.   Pulmonary/Chest: Effort normal.  Abdominal: Soft.  Neurological: She is alert and oriented to person, place, and time.  Skin: Skin is warm and dry.  Psychiatric: She has a normal mood and affect.   Procedure: Patient given informed consent, signed copy in the chart, time out was performed. Appropriate time out taken. . The patient was placed in the lithotomy position and the cervix brought into view with sterile speculum.  Portio of cervix cleansed x 2 with betadine swabs.  A tenaculum was placed in the anterior lip of the cervix.  An os finder was then used and due to stenotic cervix it could not be passed.       Assessment & Plan:   Problem List Items Addressed This Visit     Unprioritized   Heavy menstrual bleeding     Attempt at EMB unsuccessful today--will return after Cytotec for re-trial. Pt. Interested in endometrial ablation if able to follow that. Check TSH as well.    Relevant  Orders      Surgical pathology      TSH   Cervical stenosis (uterine cervix)   Relevant Medications      misoprostol (CYTOTEC) tablet    Other Visit Diagnoses   Pre-procedure lab exam    -  Primary    Relevant Orders       POCT urine pregnancy (Completed)        Return in about 2 weeks (around 10/17/2013) for endometrial biopsy.

## 2013-10-03 NOTE — Patient Instructions (Signed)
Endometrial Biopsy Endometrial biopsy is a procedure in which a tissue sample is taken from inside the uterus. The tissue sample is then looked at under a microscope to see if the tissue is normal or abnormal. The endometrium is the lining of the uterus. This procedure helps determine where you are in your menstrual cycle and how hormone levels are affecting the lining of the uterus. This procedure may also be used to evaluate uterine bleeding or to diagnose endometrial cancer, tuberculosis, polyps, or inflammatory conditions.  LET YOUR HEALTH CARE PROVIDER KNOW ABOUT:  Any allergies you have.  All medicines you are taking, including vitamins, herbs, eye drops, creams, and over-the-counter medicines.  Previous problems you or members of your family have had with the use of anesthetics.  Any blood disorders you have.  Previous surgeries you have had.  Medical conditions you have.  Possibility of pregnancy. RISKS AND COMPLICATIONS Generally, this is a safe procedure. However, as with any procedure, complications can occur. Possible complications include:  Bleeding.  Pelvic infection.  Puncture of the uterine wall with the biopsy device (rare). BEFORE THE PROCEDURE   Keep a record of your menstrual cycles as directed by your health care provider. You may need to schedule your procedure for a specific time in your cycle.  You may want to bring a sanitary pad to wear home after the procedure.  Arrange for someone to drive you home after the procedure if you will be given a medicine to help you relax (sedative). PROCEDURE   You may be given a sedative to relax you.  You will lie on an exam table with your feet and legs supported as in a pelvic exam.  Your health care provider will insert an instrument (speculum) into your vagina to see your cervix.  Your cervix will be cleansed with an antiseptic solution. A medicine (local anesthetic) will be used to numb the cervix.  A forceps  instrument (tenaculum) will be used to hold your cervix steady for the biopsy.  A thin, rodlike instrument (uterine sound) will be inserted through your cervix to determine the length of your uterus and the location where the biopsy sample will be removed.  A thin, flexible tube (catheter) will be inserted through your cervix and into the uterus. The catheter is used to collect the biopsy sample from your endometrial tissue.  The catheter and speculum will then be removed, and the tissue sample will be sent to a lab for examination. AFTER THE PROCEDURE  You will rest in a recovery area until you are ready to go home.  You may have mild cramping and a small amount of vaginal bleeding for a few days after the procedure. This is normal.  Make sure you find out how to get your test results. Document Released: 08/07/2004 Document Revised: 12/07/2012 Document Reviewed: 09/21/2012 ExitCare Patient Information 2014 [employer].  

## 2013-10-17 ENCOUNTER — Ambulatory Visit (INDEPENDENT_AMBULATORY_CARE_PROVIDER_SITE_OTHER): Payer: BC Managed Care – PPO | Admitting: Family Medicine

## 2013-10-17 ENCOUNTER — Telehealth: Payer: Self-pay | Admitting: *Deleted

## 2013-10-17 DIAGNOSIS — N882 Stricture and stenosis of cervix uteri: Secondary | ICD-10-CM

## 2013-10-17 DIAGNOSIS — N920 Excessive and frequent menstruation with regular cycle: Secondary | ICD-10-CM

## 2013-10-17 DIAGNOSIS — Z01812 Encounter for preprocedural laboratory examination: Secondary | ICD-10-CM

## 2013-10-17 LAB — POCT URINE PREGNANCY: Preg Test, Ur: NEGATIVE

## 2013-10-17 MED ORDER — MISOPROSTOL 200 MCG PO TABS: 1000.0000 ug | ORAL_TABLET | Freq: Two times a day (BID) | ORAL | Status: DC

## 2013-10-17 NOTE — Telephone Encounter (Signed)
Patient stopped the topamax due to she could not tolerate the side effects.  She is wanting to know if you have another preventative suggestion or does she have to make a follow up appointment to discuss this.

## 2013-10-17 NOTE — Progress Notes (Signed)
    Subjective:    Patient ID: Dawn Spencer is a 48 y.o. female presenting with No chief complaint on file.  on 10/17/2013  HPI: Returns for re-attempt at EMB.  Now s/p cytotec. Has long h/o cervical stenosis.  Review of Systems  Constitutional: Negative for fever and chills.  Respiratory: Negative for shortness of breath.   Cardiovascular: Negative for leg swelling.  Gastrointestinal: Negative for abdominal pain.      Objective:    LMP 09/12/2013 Physical Exam  Constitutional: She appears well-developed and well-nourished.  Cardiovascular: Normal rate.   Pulmonary/Chest: Effort normal.  Genitourinary: Vagina normal. No vaginal discharge found.  Cervix stenotic   Procedure: Patient given informed consent, signed copy in the chart, time out was performed. Appropriate time out taken. . The patient was placed in the lithotomy position and the cervix brought into view with sterile speculum.  Portio of cervix cleansed x 2 with betadine swabs.  A tenaculum was placed in the anterior lip of the cervix.  Attempt was made at sounding with a dilator, however due to stenosis this could not be accomplished and procedure abandoned.     Assessment & Plan:  Heavy menstrual bleeding Unsuccessful attempt again.  Will proceed to OR with HTA and curettage following that.     Return in about 3 months (around 01/17/2014) for postop check.

## 2013-10-17 NOTE — Patient Instructions (Signed)
Endometrial Ablation Endometrial ablation removes the lining of the uterus (endometrium). It is usually a same-day, outpatient treatment. Ablation helps avoid major surgery, such as surgery to remove the cervix and uterus (hysterectomy). After endometrial ablation, you will have little or no menstrual bleeding and may not be able to have children. However, if you are premenopausal, you will need to use a reliable method of birth control following the procedure because of the small chance that pregnancy can occur. There are different reasons to have this procedure, which include:  Heavy periods.  Bleeding that is causing anemia.  Irregular bleeding.  Bleeding fibroids on the lining inside the uterus if they are smaller than 3 centimeters. This procedure should not be done if:  You want children in the future.  You have severe cramps with your menstrual period.  You have precancerous or cancerous cells in your uterus.  You were recently pregnant.  You have gone through menopause.  You have had major surgery on the uterus, such as a cesarean delivery. LET YOUR HEALTH CARE PROVIDER KNOW ABOUT:  Any allergies you have.  All medicines you are taking, including vitamins, herbs, eye drops, creams, and over-the-counter medicines.  Previous problems you or members of your family have had with the use of anesthetics.  Any blood disorders you have.  Previous surgeries you have had.  Medical conditions you have. RISKS AND COMPLICATIONS  Generally, this is a safe procedure. However, as with any procedure, complications can occur. Possible complications include:  Perforation of the uterus.  Bleeding.  Infection of the uterus, bladder, or vagina.  Injury to surrounding organs.  An air bubble to the lung (air embolus).  Pregnancy following the procedure.  Failure of the procedure to help the problem, requiring hysterectomy.  Decreased ability to diagnose cancer in the lining of  the uterus. BEFORE THE PROCEDURE  The lining of the uterus must be tested to make sure there is no pre-cancerous or cancer cells present.  An ultrasound may be performed to look at the size of the uterus and to check for abnormalities.  Medicines may be given to thin the lining of the uterus. PROCEDURE  During the procedure, your health care provider will use a tool called a resectoscope to help see inside your uterus. There are different ways to remove the lining of your uterus.   Radiofrequency - This method uses a radiofrequency-alternating electric current to remove the lining of the uterus.  Cryotherapy - This method uses extreme cold to freeze the lining of the uterus.  Heated-Free Liquid - This method uses heated salt (saline) solution to remove the lining of the uterus.  Microwave - This method uses high-energy microwaves to heat up the lining of the uterus to remove it.  Thermal balloon - This method involves inserting a catheter with a balloon tip into the uterus. The balloon tip is filled with heated fluid to remove the lining of the uterus. AFTER THE PROCEDURE  After your procedure, do not have sexual intercourse or insert anything into your vagina until permitted by your health care provider. After the procedure, you may experience:  Cramps.  Vaginal discharge.  Frequent urination. Document Released: 02/14/2004 Document Revised: 12/07/2012 Document Reviewed: 09/07/2012 Exit[employer] Patient Information 2015 [employer]. This information is not intended to replace advice given to you by your health care provider. Make sure you discuss any questions you have with your health care provider.  

## 2013-10-17 NOTE — Assessment & Plan Note (Signed)
Unsuccessful attempt again.  Will proceed to OR with HTA and curettage following that.

## 2013-11-08 ENCOUNTER — Other Ambulatory Visit (INDEPENDENT_AMBULATORY_CARE_PROVIDER_SITE_OTHER): Payer: BC Managed Care – PPO | Admitting: *Deleted

## 2013-11-08 DIAGNOSIS — Z111 Encounter for screening for respiratory tuberculosis: Secondary | ICD-10-CM

## 2013-11-08 NOTE — Progress Notes (Addendum)
Patient came in today for a TB skin test.  0.1cc placed on right forearm ID.  Patient will return on 11/10/13 for a reading.  Lot#-[id number]  Exp-05/05/2015.

## 2013-11-10 NOTE — Addendum Note (Signed)
Addended by: Erik Obey on: 11/10/2013 09:12 AM   Modules accepted: Orders

## 2013-11-16 NOTE — H&P (Signed)
Dawn Spencer is an 48 y.o. G3P3 Unknown female.    Chief Complaint: Heavy menstrual bleeding  HPI: Patient with heavy menstrual bleeding and cramping. She had on 2 pads and bled through both and the blood flowed onto the floor. She had an ultrasound which only showed small fibroid and endometrial thickness of 3.83mm. She has a stenotic cervix and has failed 2 attempts at office EMB.   Past Medical History  Diagnosis Date  . Generalized headaches   . History of insomnia   . Bleeding from nipple in female 08/25/07    Past Surgical History  Procedure Laterality Date  . Cesarean section  90,95, 38    X3    Family History  Problem Relation Age of Onset  . Heart disease Paternal Grandfather   . Cancer Maternal Grandmother     BREAST   . Osteoporosis Mother   . Cancer Paternal Aunt     breast caner   Social History:  reports that she has never smoked. She has never used smokeless tobacco. She reports that she drinks alcohol. She reports that she does not use illicit drugs.  Allergies: No Known Allergies  Current Facility-Administered Medications on File Prior to Encounter  Medication Dose Route Frequency Provider Last Rate Last Dose  . TDaP (BOOSTRIX) injection 0.5 mL  0.5 mL Intramuscular Once Olegario Messier, NP       Current Outpatient Prescriptions on File Prior to Encounter  Medication Sig Dispense Refill  . Calcium Acetate, Phos Binder, (CALCIUM ACETATE PO) Take by mouth.        . calcium-vitamin D (OSCAL 500/200 D-3) 500-200 MG-UNIT per tablet Take 1 tablet by mouth 2 (two) times daily.  180 tablet  3  . Cholecalciferol (VITAMIN D PO) Take by mouth.        . Cyanocobalamin (VITAMIN B-12 PO) Take by mouth.        . eletriptan (RELPAX) 40 MG tablet Take 1 tablet (40 mg total) by mouth as needed for migraine. may repeat in 2 hours if necessary  27 tablet  3  . Eletriptan Hydrobromide (RELPAX PO) Take by mouth.        . Fish Oil OIL by Does not apply route.        .  misoprostol (CYTOTEC) 200 MCG tablet Take 5 tablets (1,000 mcg total) by mouth 2 (two) times daily. Take evening prior to procedure and morning of procedure  10 tablet  0  . norgestimate-ethinyl estradiol (ORTHO-CYCLEN,SPRINTEC,PREVIFEM) 0.25-35 MG-MCG tablet Take 1 tablet by mouth daily.  1 Package  11  . ondansetron (ZOFRAN ODT) 8 MG disintegrating tablet Take 1 tablet (8 mg total) by mouth every 8 (eight) hours as needed for nausea or vomiting.  20 tablet  1  . promethazine (PHENERGAN) 25 MG tablet Take 1 tablet (25 mg total) by mouth every 6 (six) hours as needed for nausea or vomiting.  30 tablet  0  . Topiramate ER (TROKENDI XR) 25 MG CP24 Take 25 mg by mouth 1 day or 1 dose.  30 capsule  6  . zolpidem (AMBIEN) 10 MG tablet Take 1 tablet (10 mg total) by mouth at bedtime as needed for sleep.  30 tablet  3    Pertinent items are noted in HPI.  General appearance: alert, cooperative and appears stated age Head: Normocephalic, without obvious abnormality, atraumatic Neck: supple, symmetrical, trachea midline Lungs: normal effort Heart: regular rate and rhythm Abdomen: soft, non-tender; bowel sounds normal; no masses,  no organomegaly Extremities: extremities normal, atraumatic, no cyanosis or edema Skin: Skin color, texture, turgor normal. No rashes or lesions Lymph nodes: Cervical, supraclavicular, and axillary nodes normal. Neurologic: Grossly normal    Lab Results  Component Value Date   PREGTESTUR Negative 10/17/2013     Assessment/Plan Patient Active Problem List   Diagnosis Date Noted  . Cervical stenosis (uterine cervix) 10/03/2013  . Heavy menstrual bleeding 08/01/2013  . Migraine, intractable 06/27/2013  . Migraine without aura 06/02/2011  . Insomnia 06/02/2011   For HTA with endometrial sampling. Risks include but are not limited to bleeding, infection, injury to surrounding structures, including bowel, bladder and ureters, blood clots, and death.  Likelihood of  success is high.   Morrison Mcbryar S 11/16/2013, 9:21 AM

## 2013-11-29 ENCOUNTER — Encounter (HOSPITAL_COMMUNITY): Payer: Self-pay | Admitting: *Deleted

## 2013-12-04 ENCOUNTER — Other Ambulatory Visit: Payer: Self-pay | Admitting: *Deleted

## 2013-12-04 DIAGNOSIS — R110 Nausea: Secondary | ICD-10-CM

## 2013-12-04 MED ORDER — ONDANSETRON 8 MG PO TBDP: 8.0000 mg | ORAL_TABLET | Freq: Three times a day (TID) | ORAL | Status: DC | PRN

## 2013-12-04 NOTE — Telephone Encounter (Signed)
Pharmacy sent refill request for odansetron.  Ok to refill.

## 2013-12-11 ENCOUNTER — Encounter (HOSPITAL_COMMUNITY): Payer: Self-pay

## 2013-12-11 MED ORDER — DEXTROSE 5 % IV SOLN
100.0000 mg | INTRAVENOUS | Status: AC
  Administered 2013-12-12: 200 mg via INTRAVENOUS
  Filled 2013-12-11: qty 100

## 2013-12-12 ENCOUNTER — Encounter (HOSPITAL_COMMUNITY): Payer: BC Managed Care – PPO | Admitting: Certified Registered Nurse Anesthetist

## 2013-12-12 ENCOUNTER — Ambulatory Visit (HOSPITAL_COMMUNITY): Payer: BC Managed Care – PPO | Admitting: Certified Registered Nurse Anesthetist

## 2013-12-12 ENCOUNTER — Ambulatory Visit (HOSPITAL_COMMUNITY)
Admission: RE | Admit: 2013-12-12 | Discharge: 2013-12-12 | Disposition: A | Discharge status: Home / Self Care | Payer: BC Managed Care – PPO | Source: Ambulatory Visit | Attending: Family Medicine | Admitting: Family Medicine

## 2013-12-12 ENCOUNTER — Encounter (HOSPITAL_COMMUNITY)
Admission: RE | Disposition: A | Discharge status: Home / Self Care | Payer: Self-pay | Source: Ambulatory Visit | Attending: Family Medicine

## 2013-12-12 ENCOUNTER — Encounter (HOSPITAL_COMMUNITY): Payer: Self-pay

## 2013-12-12 DIAGNOSIS — R51 Headache: Secondary | ICD-10-CM | POA: Diagnosis not present

## 2013-12-12 DIAGNOSIS — N920 Excessive and frequent menstruation with regular cycle: Secondary | ICD-10-CM | POA: Insufficient documentation

## 2013-12-12 DIAGNOSIS — N882 Stricture and stenosis of cervix uteri: Secondary | ICD-10-CM | POA: Diagnosis present

## 2013-12-12 HISTORY — PX: HYSTEROSCOPY: SHX211

## 2013-12-12 HISTORY — DX: Other specified postprocedural states: Z98.890

## 2013-12-12 HISTORY — DX: Nausea with vomiting, unspecified: R11.2

## 2013-12-12 HISTORY — DX: Adverse effect of unspecified anesthetic, initial encounter: T41.45XA

## 2013-12-12 LAB — CBC
HEMATOCRIT: 40.2 % (ref 36.0–46.0)
HEMOGLOBIN: 12.8 g/dL (ref 12.0–15.0)
MCH: 29.1 pg (ref 26.0–34.0)
MCHC: 31.8 g/dL (ref 30.0–36.0)
MCV: 91.4 fL (ref 78.0–100.0)
Platelets: 328 10*3/uL (ref 150–400)
RBC: 4.4 MIL/uL (ref 3.87–5.11)
RDW: 15.8 % — ABNORMAL HIGH (ref 11.5–15.5)
WBC: 7 10*3/uL (ref 4.0–10.5)

## 2013-12-12 LAB — PREGNANCY, URINE: Preg Test, Ur: NEGATIVE

## 2013-12-12 SURGERY — HYSTEROSCOPY
Anesthesia: General | Site: Vagina

## 2013-12-12 MED ORDER — SCOPOLAMINE 1 MG/3DAYS TD PT72
1.0000 | MEDICATED_PATCH | Freq: Once | TRANSDERMAL | Status: DC
  Administered 2013-12-12: 1.5 mg via TRANSDERMAL

## 2013-12-12 MED ORDER — FENTANYL CITRATE 0.05 MG/ML IJ SOLN
INTRAMUSCULAR | Status: AC
  Filled 2013-12-12: qty 2

## 2013-12-12 MED ORDER — KETOROLAC TROMETHAMINE 30 MG/ML IJ SOLN: 15.0000 mg | Freq: Once | INTRAMUSCULAR | Status: DC | PRN

## 2013-12-12 MED ORDER — PROMETHAZINE HCL 25 MG/ML IJ SOLN: 6.2500 mg | INTRAMUSCULAR | Status: DC | PRN

## 2013-12-12 MED ORDER — MIDAZOLAM HCL 5 MG/5ML IJ SOLN
INTRAMUSCULAR | Status: DC | PRN
  Administered 2013-12-12: 2 mg via INTRAVENOUS

## 2013-12-12 MED ORDER — OXYCODONE-ACETAMINOPHEN 5-325 MG PO TABS: 1.0000 | ORAL_TABLET | Freq: Four times a day (QID) | ORAL | Status: DC | PRN

## 2013-12-12 MED ORDER — ONDANSETRON HCL 4 MG/2ML IJ SOLN
INTRAMUSCULAR | Status: AC
  Filled 2013-12-12: qty 2

## 2013-12-12 MED ORDER — MIDAZOLAM HCL 2 MG/2ML IJ SOLN
INTRAMUSCULAR | Status: AC
  Filled 2013-12-12: qty 2

## 2013-12-12 MED ORDER — LIDOCAINE HCL (CARDIAC) 20 MG/ML IV SOLN
INTRAVENOUS | Status: DC | PRN
  Administered 2013-12-12: 80 mg via INTRAVENOUS

## 2013-12-12 MED ORDER — SODIUM CHLORIDE 0.9 % IR SOLN
Status: DC | PRN
  Administered 2013-12-12: 3000 mL

## 2013-12-12 MED ORDER — PROPOFOL 10 MG/ML IV EMUL
INTRAVENOUS | Status: AC
  Filled 2013-12-12: qty 20

## 2013-12-12 MED ORDER — DEXAMETHASONE SODIUM PHOSPHATE 4 MG/ML IJ SOLN
INTRAMUSCULAR | Status: DC | PRN
  Administered 2013-12-12: 4 mg via INTRAVENOUS

## 2013-12-12 MED ORDER — LIDOCAINE HCL 1 % IJ SOLN
INTRAMUSCULAR | Status: AC
  Filled 2013-12-12: qty 20

## 2013-12-12 MED ORDER — LACTATED RINGERS IV SOLN
INTRAVENOUS | Status: DC
  Administered 2013-12-12 (×2): via INTRAVENOUS

## 2013-12-12 MED ORDER — FENTANYL CITRATE 0.05 MG/ML IJ SOLN
25.0000 ug | INTRAMUSCULAR | Status: DC | PRN
  Administered 2013-12-12: 50 ug via INTRAVENOUS

## 2013-12-12 MED ORDER — LACTATED RINGERS IV SOLN: INTRAVENOUS | Status: DC

## 2013-12-12 MED ORDER — ONDANSETRON HCL 4 MG/2ML IJ SOLN
INTRAMUSCULAR | Status: DC | PRN
  Administered 2013-12-12: 4 mg via INTRAVENOUS

## 2013-12-12 MED ORDER — KETOROLAC TROMETHAMINE 30 MG/ML IJ SOLN
INTRAMUSCULAR | Status: DC | PRN
  Administered 2013-12-12: 30 mg via INTRAVENOUS

## 2013-12-12 MED ORDER — FENTANYL CITRATE 0.05 MG/ML IJ SOLN
INTRAMUSCULAR | Status: DC | PRN
  Administered 2013-12-12 (×2): 50 ug via INTRAVENOUS

## 2013-12-12 MED ORDER — DIPHENHYDRAMINE HCL 50 MG/ML IJ SOLN
INTRAMUSCULAR | Status: AC
  Filled 2013-12-12: qty 1

## 2013-12-12 MED ORDER — DEXAMETHASONE SODIUM PHOSPHATE 4 MG/ML IJ SOLN
INTRAMUSCULAR | Status: AC
  Filled 2013-12-12: qty 1

## 2013-12-12 MED ORDER — DIPHENHYDRAMINE HCL 50 MG/ML IJ SOLN
INTRAMUSCULAR | Status: DC | PRN
  Administered 2013-12-12: 12.5 mg via INTRAVENOUS

## 2013-12-12 MED ORDER — LIDOCAINE HCL 1 % IJ SOLN
INTRAMUSCULAR | Status: DC | PRN
  Administered 2013-12-12: 20 mL

## 2013-12-12 MED ORDER — PROPOFOL 10 MG/ML IV BOLUS
INTRAVENOUS | Status: DC | PRN
  Administered 2013-12-12: 180 mg via INTRAVENOUS

## 2013-12-12 MED ORDER — LIDOCAINE HCL (CARDIAC) 20 MG/ML IV SOLN
INTRAVENOUS | Status: AC
  Filled 2013-12-12: qty 5

## 2013-12-12 MED ORDER — MEPERIDINE HCL 25 MG/ML IJ SOLN: 6.2500 mg | INTRAMUSCULAR | Status: DC | PRN

## 2013-12-12 MED ORDER — SCOPOLAMINE 1 MG/3DAYS TD PT72
MEDICATED_PATCH | TRANSDERMAL | Status: AC
  Filled 2013-12-12: qty 1

## 2013-12-12 SURGICAL SUPPLY — 15 items
CANISTER SUCT 3000ML (MISCELLANEOUS) ×2 IMPLANT
CATH ROBINSON RED A/P 16FR (CATHETERS) ×2 IMPLANT
CLOTH BEACON ORANGE TIMEOUT ST (SAFETY) ×2 IMPLANT
CONTAINER PREFILL 10% NBF 60ML (FORM) ×4 IMPLANT
DRAPE HYSTEROSCOPY (DRAPE) ×2 IMPLANT
DRSG TELFA 3X8 NADH (GAUZE/BANDAGES/DRESSINGS) ×2 IMPLANT
GLOVE BIOGEL PI IND STRL 7.0 (GLOVE) ×1 IMPLANT
GLOVE BIOGEL PI INDICATOR 7.0 (GLOVE) ×1
GLOVE ECLIPSE 7.0 STRL STRAW (GLOVE) ×4 IMPLANT
GOWN STRL REUS W/TWL LRG LVL3 (GOWN DISPOSABLE) ×6 IMPLANT
PACK VAGINAL MINOR WOMEN LF (CUSTOM PROCEDURE TRAY) ×2 IMPLANT
PAD OB MATERNITY 4.3X12.25 (PERSONAL CARE ITEMS) ×2 IMPLANT
SET GENESYS HTA PROCERVA (MISCELLANEOUS) ×2 IMPLANT
TOWEL OR 17X24 6PK STRL BLUE (TOWEL DISPOSABLE) ×4 IMPLANT
WATER STERILE IRR 1000ML POUR (IV SOLUTION) ×2 IMPLANT

## 2013-12-12 NOTE — Op Note (Signed)
PROCEDURE DATE: 12/12/2013  PREOPERATIVE DIAGNOSIS: Heavy menstrual bleeding  POSTOPERATIVE DIAGNOSIS: The same  PROCEDURE:     Dilation and hysteroscopy  SURGEON:  Ioma Chismar S  INDICATIONS: Pt. With h/o heavy menstrual bleeding and cervical stenosis.  Failed office EMB x 2.  Risks of surgery were discussed with the patient including but not limited to: bleeding which may require transfusion; infection which may require antibiotics; injury to uterus or surrounding organs.  Likelihood of obtaining sample is high.  FINDINGS:  A 9 wk size uterus, stenotic appearing cervix.  ANESTHESIA:    Monitored intravenous sedation, paracervical block-1% Lidocaine  ESTIMATED BLOOD LOSS:  Less than 20 ml.  Fluid loss: 1 L NS  SPECIMENS:  None  COMPLICATIONS:  None immediate. Possible uterine perforation  PROCEDURE DETAILS:  The patient received intravenous antibiotics while in the preoperative area.  She was then taken to the operating room where general anesthesia was administered and was found to be adequate.  After an adequate timeout was performed, she was placed in the dorsal lithotomy position and examined; then prepped and draped in the sterile manner.   Her bladder was catheterized for an unmeasured amount of clear, yellow urine. A vaginal speculum was then placed in the patient's vagina and a single tooth tenaculum was applied to the anterior lip of the cervix.  A paracervical block using 1% Lidocaine with Epi was administered. The cervix was again stenotic despite cytotec. The cervix was gently dilated using very small dilators and advancing up. The hysteroscope was introduced and it did not appear to be in the uterus. There was minimal bleeding noted and pressure was used on the cervix to stop the bleeding.  The patient was stable and the tenaculum removed with good hemostasis noted.  The patient tolerated the procedure well.  The patient was taken to the recovery area in stable  condition.  Sanda Dejoy SMD 12/12/2013 4:05 PM

## 2013-12-12 NOTE — Anesthesia Postprocedure Evaluation (Signed)
  Anesthesia Post-op Note  Anesthesia Post Note  Patient: Dawn Spencer  Procedure(s) Performed: Procedure(s) (LRB): HYSTEROSCOPY WITH DILATION (N/A)  Anesthesia type: General  Patient location: PACU  Post pain: Pain level controlled  Post assessment: Post-op Vital signs reviewed  Last Vitals:  Filed Vitals:   12/12/13 1715  BP: 117/69  Pulse: 72  Temp:   Resp: 14    Post vital signs: Reviewed  Level of consciousness: sedated  Complications: No apparent anesthesia complications

## 2013-12-12 NOTE — Discharge Instructions (Signed)
Hysteroscopy DO NOT TAKE IBUPROFEN (MOTRIN, ADVIL) OR ALEVE UNTIL 10:00 PM!!! Hysteroscopy is a procedure used for looking inside the womb (uterus). It may be done for various reasons, including:  To evaluate abnormal bleeding, fibroid (benign, noncancerous) tumors, polyps, scar tissue (adhesions), and possibly cancer of the uterus.  To look for lumps (tumors) and other uterine growths.  To look for causes of why a woman cannot get pregnant (infertility), causes of recurrent loss of pregnancy (miscarriages), or a lost intrauterine device (IUD).  To perform a sterilization by blocking the fallopian tubes from inside the uterus. In this procedure, a thin, flexible tube with a tiny light and camera on the end of it (hysteroscope) is used to look inside the uterus. A hysteroscopy should be done right after a menstrual period to be sure you are not pregnant. LET [facility] CARE PROVIDER KNOW ABOUT:   Any allergies you have.  All medicines you are taking, including vitamins, herbs, eye drops, creams, and over-the-counter medicines.  Previous problems you or members of your family have had with the use of anesthetics.  Any blood disorders you have.  Previous surgeries you have had.  Medical conditions you have. RISKS AND COMPLICATIONS  Generally, this is a safe procedure. However, as with any procedure, complications can occur. Possible complications include:  Putting a hole in the uterus.  Excessive bleeding.  Infection.  Damage to the cervix.  Injury to other organs.  Allergic reaction to medicines.  Too much fluid used in the uterus for the procedure. BEFORE THE PROCEDURE   Ask your health care provider about changing or stopping any regular medicines.  Do not take aspirin or blood thinners for 1 week before the procedure, or as directed by your health care provider. These can cause bleeding.  If you smoke, do not smoke for 2 weeks before the procedure.  In some cases, a  medicine is placed in the cervix the day before the procedure. This medicine makes the cervix have a larger opening (dilate). This makes it easier for the instrument to be inserted into the uterus during the procedure.  Do not eat or drink anything for at least 8 hours before the surgery.  Arrange for someone to take you home after the procedure. PROCEDURE   You may be given a medicine to relax you (sedative). You may also be given one of the following:  A medicine that numbs the area around the cervix (local anesthetic).  A medicine that makes you sleep through the procedure (general anesthetic).  The hysteroscope is inserted through the vagina into the uterus. The camera on the hysteroscope sends a picture to a TV screen. This gives the surgeon a good view inside the uterus.  During the procedure, air or a liquid is put into the uterus, which allows the surgeon to see better.  Sometimes, tissue is gently scraped from inside the uterus. These tissue samples are sent to a lab for testing. AFTER THE PROCEDURE   If you had a general anesthetic, you may be groggy for a couple hours after the procedure.  If you had a local anesthetic, you will be able to go home as soon as you are stable and feel ready.  You may have some cramping. This normally lasts for a couple days.  You may have bleeding, which varies from light spotting for a few days to menstrual-like bleeding for 3-7 days. This is normal.  If your test results are not back during the visit, make  an appointment with your health care provider to find out the results. Document Released: 07/13/2000 Document Revised: 01/25/2013 Document Reviewed: 11/03/2012 [facility] Patient Information 2015 [place], [place]. This information is not intended to replace advice given to you by your health care provider. Make sure you discuss any questions you have with your health care provider.

## 2013-12-12 NOTE — Transfer of Care (Signed)
Immediate Anesthesia Transfer of Care Note  Patient: Dawn Spencer  Procedure(s) Performed: Procedure(s): HYSTEROSCOPY WITH DILATION (N/A)  Patient Location: PACU  Anesthesia Type:General  Level of Consciousness: awake, alert  and oriented  Airway & Oxygen Therapy: Patient Spontanous Breathing and Patient connected to nasal cannula oxygen  Post-op Assessment: Report given to PACU RN, Post -op Vital signs reviewed and stable and Patient moving all extremities  Post vital signs: Reviewed and stable  Complications: No apparent anesthesia complications

## 2013-12-12 NOTE — Anesthesia Preprocedure Evaluation (Signed)
Anesthesia Evaluation  Patient identified by MRN, date of birth, ID band Patient awake    Reviewed: Allergy & Precautions, H&P , NPO status , Patient's Chart, lab work & pertinent test results, reviewed documented beta blocker date and time   History of Anesthesia Complications (+) PONV and history of anesthetic complications  Airway Mallampati: III TM Distance: >3 FB Neck ROM: full    Dental  (+) Teeth Intact   Pulmonary neg pulmonary ROS,  breath sounds clear to auscultation  Pulmonary exam normal       Cardiovascular Exercise Tolerance: Good negative cardio ROS  Rhythm:regular Rate:Normal     Neuro/Psych  Headaches (migraines - twice a month, currently has a headache), negative psych ROS   GI/Hepatic negative GI ROS, Neg liver ROS,   Endo/Other  BMI 36.4  Renal/GU negative Renal ROS  Female GU complaint     Musculoskeletal   Abdominal   Peds  Hematology negative hematology ROS (+)   Anesthesia Other Findings Ibuprofen at 7 am Jello at 10 am  Reproductive/Obstetrics negative OB ROS                           Anesthesia Physical Anesthesia Plan  ASA: II  Anesthesia Plan: General LMA   Post-op Pain Management:    Induction:   Airway Management Planned:   Additional Equipment:   Intra-op Plan:   Post-operative Plan:   Informed Consent: I have reviewed the patients History and Physical, chart, labs and discussed the procedure including the risks, benefits and alternatives for the proposed anesthesia with the patient or authorized representative who has indicated his/her understanding and acceptance.   Dental Advisory Given  Plan Discussed with: CRNA and Surgeon  Anesthesia Plan Comments:         Anesthesia Quick Evaluation

## 2013-12-12 NOTE — Interval H&P Note (Signed)
History and Physical Interval Note:  12/12/2013 2:18 PM  Dawn Spencer  has presented today for surgery, with the diagnosis of Heavy menstrual bleeding  The various methods of treatment have been discussed with the patient and family. After consideration of risks, benefits and other options for treatment, the patient has consented to  Procedure(s) with comments: DILATATION & CURETTAGE/HYSTEROSCOPY WITH HYDROTHERMAL ABLATION (N/A) - Dr. Kennon Rounds requesting a HTA and currettage as a surgical intervention .  The patient's history has been reviewed, patient examined, no change in status, stable for surgery.  I have reviewed the patient's chart and labs.  Questions were answered to the patient's satisfaction.     [place]

## 2013-12-13 ENCOUNTER — Encounter (HOSPITAL_COMMUNITY): Payer: Self-pay | Admitting: Family Medicine

## 2013-12-15 ENCOUNTER — Encounter: Payer: Self-pay | Admitting: Family Medicine

## 2013-12-15 ENCOUNTER — Ambulatory Visit (INDEPENDENT_AMBULATORY_CARE_PROVIDER_SITE_OTHER): Payer: BC Managed Care – PPO | Admitting: Family Medicine

## 2013-12-15 VITALS — BP 129/88 | HR 96 | Ht 60.0 in | Wt 189.0 lb

## 2013-12-15 DIAGNOSIS — N920 Excessive and frequent menstruation with regular cycle: Secondary | ICD-10-CM

## 2013-12-15 DIAGNOSIS — Z09 Encounter for follow-up examination after completed treatment for conditions other than malignant neoplasm: Secondary | ICD-10-CM | POA: Diagnosis not present

## 2013-12-15 DIAGNOSIS — G43909 Migraine, unspecified, not intractable, without status migrainosus: Secondary | ICD-10-CM

## 2013-12-15 MED ORDER — SUMATRIPTAN SUCCINATE 100 MG PO TABS: 100.0000 mg | ORAL_TABLET | Freq: Once | ORAL | Status: DC | PRN

## 2013-12-15 MED ORDER — MEGESTROL ACETATE 40 MG PO TABS: 40.0000 mg | ORAL_TABLET | Freq: Two times a day (BID) | ORAL | Status: DC

## 2013-12-15 NOTE — Assessment & Plan Note (Addendum)
Will give Megace to control cycle until scheduled for TVH. Risks include but are not limited to bleeding, infection, injury to surrounding structures, including bowel, bladder and ureters, blood clots, and death. Discussed possiblity, though small of undiagnosed cancer and possible need for re-operation.  Would leave ovaries at this point.

## 2013-12-15 NOTE — Addendum Note (Signed)
Addendum created 12/15/13 9450 by Assunta Gambles, MD   Modules edited: Anesthesia Attestations

## 2013-12-15 NOTE — Progress Notes (Signed)
    Subjective:    Patient ID: Dawn Spencer is a 48 y.o. female presenting with Follow-up  on 12/15/2013  HPI: Here after surgery.  Underwent attempt at D & C with hysteroscopy, but unable to get into cavity with stenotic cervix. Continued heavy vaginal bleeding.  Desires definitive surgery. Had some shoulder pain, but feeling better now. Considering lap band surgery, would like to discuss with general surgery.  Review of Systems  Constitutional: Negative for fever and chills.  Respiratory: Negative for shortness of breath.   Cardiovascular: Negative for chest pain.  Gastrointestinal: Negative for nausea, vomiting and abdominal pain.  Genitourinary: Negative for dysuria.  Skin: Negative for rash.      Objective:    BP 129/88  Pulse 96  Ht 5' (1.524 m)  Wt 189 lb (85.73 kg)  BMI 36.91 kg/m2  LMP 12/11/2013 Physical Exam  Constitutional: She is oriented to person, place, and time. She appears well-developed and well-nourished. No distress.  HENT:  Head: Normocephalic and atraumatic.  Eyes: No scleral icterus.  Neck: Neck supple.  Cardiovascular: Normal rate.   Pulmonary/Chest: Effort normal.  Abdominal: Soft.  Neurological: She is alert and oriented to person, place, and time.  Skin: Skin is warm and dry.  Psychiatric: She has a normal mood and affect.        Assessment & Plan:   Problem List Items Addressed This Visit     Unprioritized   Heavy menstrual bleeding     Will give Megace to control cycle until scheduled for TVH. Risks include but are not limited to bleeding, infection, injury to surrounding structures, including bowel, bladder and ureters, blood clots, and death. Discussed possiblity, though small of undiagnosed cancer and possible need for re-operation.  Would leave ovaries at this point.    Relevant Medications      megestrol (MEGACE) tablet    Other Visit Diagnoses   Migraine without status migrainosus, not intractable, unspecified migraine  type    -  Primary    Relevant Medications       SUMAtriptan (IMITREX) 100 MG tablet       SUMAtriptan (IMITREX) tablet    Follow-up examination, following unspecified surgery            Return in about 4 months (around 04/16/2014) for a follow-up.

## 2013-12-21 ENCOUNTER — Telehealth: Payer: Self-pay | Admitting: *Deleted

## 2013-12-21 DIAGNOSIS — G43901 Migraine, unspecified, not intractable, with status migrainosus: Secondary | ICD-10-CM

## 2013-12-21 MED ORDER — PROMETHAZINE HCL 25 MG PO TABS: 25.0000 mg | ORAL_TABLET | Freq: Four times a day (QID) | ORAL | Status: DC | PRN

## 2013-12-21 NOTE — Telephone Encounter (Signed)
Pharmacy called and patient needs a refill on her Promethazine. I have sent in refills to patients pharmacy.

## 2014-01-02 ENCOUNTER — Encounter: Payer: BC Managed Care – PPO | Admitting: Family Medicine

## 2014-02-19 ENCOUNTER — Encounter: Payer: Self-pay | Admitting: Family Medicine

## 2014-04-08 NOTE — H&P (Signed)
Dawn Spencer is an 48 y.o. G3P3 female.   Chief Complaint: Heavy vaginal bleeding HPI: Patient with heavy menstrual bleeding and cramping. She had on 2 pads and bled through both and the blood flowed onto the floor. She had an ultrasound which only showed small fibroid and endometrial thickness of 3.81mm. She has a stenotic cervix and has failed 2 attempts at office EMB, also failed attempt at D & C hysteroscopy and now desires definitive treatment.  Past Medical History  Diagnosis Date  . Generalized headaches   . History of insomnia   . Bleeding from nipple in female 08/25/07  . Complication of anesthesia 1997    difficult spinal   . PONV (postoperative nausea and vomiting)     Past Surgical History  Procedure Laterality Date  . Cesarean section  90,95, 97    X3  . Tonsillectomy    . Hysteroscopy N/A 12/12/2013    Procedure: HYSTEROSCOPY WITH DILATION;  Surgeon: Donnamae Jude, MD;  Location: [facility];  Service: Gynecology;  Laterality: N/A;    Family History  Problem Relation Age of Onset  . Heart disease Paternal Grandfather   . Cancer Maternal Grandmother     BREAST   . Osteoporosis Mother   . Cancer Paternal Aunt     breast caner   Social History:  reports that she has never smoked. She has never used smokeless tobacco. She reports that she drinks alcohol. She reports that she does not use illicit drugs.  Allergies: No Known Allergies  Current Facility-Administered Medications on File Prior to Encounter  Medication Dose Route Frequency Provider Last Rate Last Dose  . TDaP (BOOSTRIX) injection 0.5 mL  0.5 mL Intramuscular Once Olegario Messier, NP       Current Outpatient Prescriptions on File Prior to Encounter  Medication Sig Dispense Refill  . calcium-vitamin D (OSCAL 500/200 D-3) 500-200 MG-UNIT per tablet Take 1 tablet by mouth 2 (two) times daily. 180 tablet 3  . megestrol (MEGACE) 40 MG tablet Take 1 tablet (40 mg total) by mouth 2 (two) times daily. Up to tid  with cycle 30 tablet 3  . ondansetron (ZOFRAN ODT) 8 MG disintegrating tablet Take 1 tablet (8 mg total) by mouth every 8 (eight) hours as needed for nausea or vomiting. 20 tablet 1  . promethazine (PHENERGAN) 25 MG tablet Take 1 tablet (25 mg total) by mouth every 6 (six) hours as needed for nausea or vomiting. 30 tablet 1  . SUMAtriptan (IMITREX) 100 MG tablet Take 100 mg by mouth every 2 (two) hours as needed for migraine or headache. May repeat in 2 hours if headache persists or recurs.    . SUMAtriptan (IMITREX) 100 MG tablet Take 1 tablet (100 mg total) by mouth once as needed for migraine or headache. May repeat in 2 hours if headache persists or recurs. 9 tablet 11  . zolpidem (AMBIEN) 10 MG tablet Take 1 tablet (10 mg total) by mouth at bedtime as needed for sleep. 30 tablet 3  . oxyCODONE-acetaminophen (PERCOCET/ROXICET) 5-325 MG per tablet Take 1-2 tablets by mouth every 6 (six) hours as needed. (Patient not taking: Reported on 04/03/2014) 10 tablet 0    Pertinent items are noted in HPI.  General appearance: alert, cooperative and appears stated age Neck: supple, symmetrical, trachea midline Lungs: normal effort Heart: regular rate and rhythm Abdomen: soft, non-tender; bowel sounds normal; no masses,  no organomegaly Extremities: extremities normal, atraumatic, no cyanosis or edema Skin: Skin color, texture,  turgor normal. No rashes or lesions Neurologic: Grossly normal   Lab Results  Component Value Date   WBC 7.0 12/12/2013   HGB 12.8 12/12/2013   HCT 40.2 12/12/2013   MCV 91.4 12/12/2013   PLT 328 12/12/2013   Lab Results  Component Value Date   PREGTESTUR NEGATIVE 12/12/2013     Assessment/Plan Patient Active Problem List   Diagnosis Date Noted  . Cervical stenosis (uterine cervix) 10/03/2013  . Heavy menstrual bleeding 08/01/2013  . Migraine, intractable 06/27/2013  . Migraine without aura 06/02/2011  . Insomnia 06/02/2011   For TVH. Risks include but are  not limited to bleeding, infection, injury to surrounding structures, including bowel, bladder and ureters, blood clots, and death. Discussed possiblity, though small of undiagnosed cancer and possible need for re-operation. Would leave ovaries at this point.  Jelina Paulsen S 04/08/2014, 3:26 PM

## 2014-04-10 ENCOUNTER — Other Ambulatory Visit: Payer: Self-pay | Admitting: *Deleted

## 2014-04-10 ENCOUNTER — Encounter: Payer: Self-pay | Admitting: *Deleted

## 2014-04-10 DIAGNOSIS — R110 Nausea: Secondary | ICD-10-CM

## 2014-04-10 DIAGNOSIS — G43901 Migraine, unspecified, not intractable, with status migrainosus: Secondary | ICD-10-CM

## 2014-04-10 MED ORDER — ONDANSETRON 8 MG PO TBDP: 8.0000 mg | ORAL_TABLET | Freq: Three times a day (TID) | ORAL | Status: DC | PRN

## 2014-04-10 MED ORDER — PROMETHAZINE HCL 25 MG PO TABS: 25.0000 mg | ORAL_TABLET | Freq: Four times a day (QID) | ORAL | Status: DC | PRN

## 2014-04-10 NOTE — Telephone Encounter (Signed)
Patient needs refill of nausea medication.

## 2014-04-11 ENCOUNTER — Encounter (HOSPITAL_COMMUNITY)
Admission: RE | Admit: 2014-04-11 | Discharge: 2014-04-11 | Disposition: A | Discharge status: Home / Self Care | Payer: BC Managed Care – PPO | Source: Ambulatory Visit | Attending: Family Medicine | Admitting: Family Medicine

## 2014-04-11 ENCOUNTER — Encounter (HOSPITAL_COMMUNITY): Payer: Self-pay

## 2014-04-11 DIAGNOSIS — Z01812 Encounter for preprocedural laboratory examination: Secondary | ICD-10-CM | POA: Diagnosis present

## 2014-04-11 LAB — CBC
HCT: 38 % (ref 36.0–46.0)
Hemoglobin: 12.2 g/dL (ref 12.0–15.0)
MCH: 29.8 pg (ref 26.0–34.0)
MCHC: 32.1 g/dL (ref 30.0–36.0)
MCV: 92.9 fL (ref 78.0–100.0)
Platelets: 369 K/uL (ref 150–400)
RBC: 4.09 MIL/uL (ref 3.87–5.11)
RDW: 15.1 % (ref 11.5–15.5)
WBC: 8.3 K/uL (ref 4.0–10.5)

## 2014-04-11 NOTE — Patient Instructions (Signed)
   Your procedure is scheduled on: DEC 29 AT 730AM  Enter through the Main Entrance of [facility] at: 6AM Pick up the phone at the desk and dial [phone] and inform us of your arrival.  Please call this number if you have any problems the morning of surgery: [phone]  Remember: Do not eat food after midnight: DEC 28 Do not drink clear liquids after: DEC 28 Take these medicines the morning of surgery with a SIP OF WATER:  Do not wear jewelry, make-up, or FINGER nail polish No metal in your hair or on your body. Do not wear lotions, powders, perfumes.  You may wear deodorant.  Do not bring valuables to the hospital. Contacts, dentures or bridgework may not be worn into surgery.  Leave suitcase in the car. After Surgery it may be brought to your room. For patients being admitted to the hospital, checkout time is 11:00am the day of discharge.    Patients discharged on the day of surgery will not be allowed to drive home.

## 2014-04-12 ENCOUNTER — Other Ambulatory Visit: Payer: Self-pay | Admitting: Family Medicine

## 2014-04-16 ENCOUNTER — Telehealth: Payer: Self-pay | Admitting: *Deleted

## 2014-04-16 DIAGNOSIS — G4700 Insomnia, unspecified: Secondary | ICD-10-CM

## 2014-04-16 MED ORDER — ZOLPIDEM TARTRATE 10 MG PO TABS: 10.0000 mg | ORAL_TABLET | Freq: Every evening | ORAL | Status: DC | PRN

## 2014-04-16 NOTE — Telephone Encounter (Signed)
Per Dr. Kennon Rounds, call in Ambien 10 mg tablet, #30 no refills for patient.   Done.

## 2014-04-17 ENCOUNTER — Ambulatory Visit (HOSPITAL_COMMUNITY): Payer: BC Managed Care – PPO | Admitting: Anesthesiology

## 2014-04-17 ENCOUNTER — Encounter (HOSPITAL_COMMUNITY): Payer: Self-pay | Admitting: Anesthesiology

## 2014-04-17 ENCOUNTER — Observation Stay (HOSPITAL_COMMUNITY)
Admission: RE | Admit: 2014-04-17 | Discharge: 2014-04-19 | Disposition: A | Discharge status: Home / Self Care | Payer: BC Managed Care – PPO | Source: Ambulatory Visit | Attending: Family Medicine | Admitting: Family Medicine

## 2014-04-17 ENCOUNTER — Encounter (HOSPITAL_COMMUNITY)
Admission: RE | Disposition: A | Discharge status: Home / Self Care | Payer: Self-pay | Source: Ambulatory Visit | Attending: Family Medicine

## 2014-04-17 DIAGNOSIS — N946 Dysmenorrhea, unspecified: Secondary | ICD-10-CM | POA: Diagnosis not present

## 2014-04-17 DIAGNOSIS — N920 Excessive and frequent menstruation with regular cycle: Secondary | ICD-10-CM | POA: Diagnosis not present

## 2014-04-17 DIAGNOSIS — N938 Other specified abnormal uterine and vaginal bleeding: Secondary | ICD-10-CM | POA: Insufficient documentation

## 2014-04-17 DIAGNOSIS — Z90710 Acquired absence of both cervix and uterus: Secondary | ICD-10-CM | POA: Diagnosis not present

## 2014-04-17 DIAGNOSIS — N882 Stricture and stenosis of cervix uteri: Secondary | ICD-10-CM

## 2014-04-17 HISTORY — PX: VAGINAL HYSTERECTOMY: SHX2639

## 2014-04-17 SURGERY — HYSTERECTOMY, VAGINAL
Anesthesia: General | Site: Vagina

## 2014-04-17 MED ORDER — DEXTROSE IN LACTATED RINGERS 5 % IV SOLN
INTRAVENOUS | Status: DC
Start: 2014-04-17 — End: 2014-04-19
  Administered 2014-04-17 (×2): via INTRAVENOUS

## 2014-04-17 MED ORDER — GLYCOPYRROLATE 0.2 MG/ML IJ SOLN
INTRAMUSCULAR | Status: AC
  Filled 2014-04-17: qty 4

## 2014-04-17 MED ORDER — LIDOCAINE-EPINEPHRINE 1 %-1:100000 IJ SOLN
INTRAMUSCULAR | Status: DC | PRN
  Administered 2014-04-17: 40 mL

## 2014-04-17 MED ORDER — DIPHENHYDRAMINE HCL 12.5 MG/5ML PO ELIX: 12.5000 mg | ORAL_SOLUTION | Freq: Four times a day (QID) | ORAL | Status: DC | PRN

## 2014-04-17 MED ORDER — ZOLPIDEM TARTRATE 5 MG PO TABS
5.0000 mg | ORAL_TABLET | Freq: Every evening | ORAL | Status: DC | PRN
  Administered 2014-04-17 – 2014-04-18 (×2): 5 mg via ORAL
  Filled 2014-04-17 (×2): qty 1

## 2014-04-17 MED ORDER — ONDANSETRON HCL 4 MG/2ML IJ SOLN
INTRAMUSCULAR | Status: AC
  Filled 2014-04-17: qty 2

## 2014-04-17 MED ORDER — LIDOCAINE-EPINEPHRINE 1 %-1:100000 IJ SOLN
INTRAMUSCULAR | Status: AC
  Filled 2014-04-17: qty 1

## 2014-04-17 MED ORDER — ACETAMINOPHEN 160 MG/5ML PO SOLN: 325.0000 mg | ORAL | Status: DC | PRN

## 2014-04-17 MED ORDER — KETOROLAC TROMETHAMINE 30 MG/ML IJ SOLN
30.0000 mg | Freq: Four times a day (QID) | INTRAMUSCULAR | Status: DC
  Administered 2014-04-17 – 2014-04-18 (×4): 30 mg via INTRAVENOUS
  Filled 2014-04-17 (×3): qty 1

## 2014-04-17 MED ORDER — LACTATED RINGERS IV SOLN: INTRAVENOUS | Status: DC

## 2014-04-17 MED ORDER — PROMETHAZINE HCL 25 MG/ML IJ SOLN: 6.2500 mg | INTRAMUSCULAR | Status: DC | PRN

## 2014-04-17 MED ORDER — LACTATED RINGERS IV SOLN
INTRAVENOUS | Status: DC
  Administered 2014-04-17 (×3): via INTRAVENOUS

## 2014-04-17 MED ORDER — STERILE WATER FOR IRRIGATION IR SOLN
Status: DC | PRN
  Administered 2014-04-17: 1000 mL via INTRAVESICAL

## 2014-04-17 MED ORDER — ACETAMINOPHEN 325 MG PO TABS: 325.0000 mg | ORAL_TABLET | ORAL | Status: DC | PRN

## 2014-04-17 MED ORDER — PROPOFOL 10 MG/ML IV EMUL
INTRAVENOUS | Status: AC
  Filled 2014-04-17: qty 20

## 2014-04-17 MED ORDER — IBUPROFEN 600 MG PO TABS: 600.0000 mg | ORAL_TABLET | Freq: Four times a day (QID) | ORAL | Status: DC | PRN

## 2014-04-17 MED ORDER — CEFAZOLIN SODIUM-DEXTROSE 2-3 GM-% IV SOLR
2.0000 g | INTRAVENOUS | Status: AC
  Administered 2014-04-17: 2 g via INTRAVENOUS

## 2014-04-17 MED ORDER — NEOSTIGMINE METHYLSULFATE 10 MG/10ML IV SOLN
INTRAVENOUS | Status: DC | PRN
  Administered 2014-04-17: 2 mg via INTRAVENOUS

## 2014-04-17 MED ORDER — GLYCOPYRROLATE 0.2 MG/ML IJ SOLN
INTRAMUSCULAR | Status: DC | PRN
  Administered 2014-04-17: 0.4 mg via INTRAVENOUS

## 2014-04-17 MED ORDER — HYDROMORPHONE HCL 1 MG/ML IJ SOLN
INTRAMUSCULAR | Status: AC
  Filled 2014-04-17: qty 1

## 2014-04-17 MED ORDER — KETOROLAC TROMETHAMINE 30 MG/ML IJ SOLN
30.0000 mg | Freq: Once | INTRAMUSCULAR | Status: DC
  Filled 2014-04-17: qty 1

## 2014-04-17 MED ORDER — ONDANSETRON HCL 4 MG/2ML IJ SOLN
4.0000 mg | Freq: Four times a day (QID) | INTRAMUSCULAR | Status: DC | PRN
  Administered 2014-04-17 – 2014-04-19 (×6): 4 mg via INTRAVENOUS
  Filled 2014-04-17 (×6): qty 2

## 2014-04-17 MED ORDER — KETOROLAC TROMETHAMINE 30 MG/ML IJ SOLN: 30.0000 mg | Freq: Four times a day (QID) | INTRAMUSCULAR | Status: DC

## 2014-04-17 MED ORDER — KETOROLAC TROMETHAMINE 30 MG/ML IJ SOLN
INTRAMUSCULAR | Status: AC
  Administered 2014-04-17: 30 mg via INTRAVENOUS
  Filled 2014-04-17: qty 1

## 2014-04-17 MED ORDER — FENTANYL CITRATE 0.05 MG/ML IJ SOLN
INTRAMUSCULAR | Status: AC
  Filled 2014-04-17: qty 5

## 2014-04-17 MED ORDER — HYDROMORPHONE HCL 1 MG/ML IJ SOLN
INTRAMUSCULAR | Status: DC | PRN
  Administered 2014-04-17: 1 mg via INTRAVENOUS

## 2014-04-17 MED ORDER — LIDOCAINE HCL (CARDIAC) 20 MG/ML IV SOLN
INTRAVENOUS | Status: DC | PRN
  Administered 2014-04-17: 60 mg via INTRAVENOUS

## 2014-04-17 MED ORDER — HYDROMORPHONE HCL 1 MG/ML IJ SOLN
0.2500 mg | INTRAMUSCULAR | Status: DC | PRN
  Administered 2014-04-17 (×2): 0.5 mg via INTRAVENOUS

## 2014-04-17 MED ORDER — 0.9 % SODIUM CHLORIDE (POUR BTL) OPTIME
TOPICAL | Status: DC | PRN
  Administered 2014-04-17: 1000 mL

## 2014-04-17 MED ORDER — INFLUENZA VAC SPLIT QUAD 0.5 ML IM SUSY
0.5000 mL | PREFILLED_SYRINGE | INTRAMUSCULAR | Status: AC
  Administered 2014-04-18: 0.5 mL via INTRAMUSCULAR

## 2014-04-17 MED ORDER — DEXAMETHASONE SODIUM PHOSPHATE 10 MG/ML IJ SOLN
INTRAMUSCULAR | Status: DC | PRN
  Administered 2014-04-17: 10 mg via INTRAVENOUS

## 2014-04-17 MED ORDER — HYDROMORPHONE HCL 1 MG/ML IJ SOLN
INTRAMUSCULAR | Status: AC
  Administered 2014-04-17: 0.5 mg via INTRAVENOUS
  Filled 2014-04-17: qty 1

## 2014-04-17 MED ORDER — DEXAMETHASONE SODIUM PHOSPHATE 10 MG/ML IJ SOLN
INTRAMUSCULAR | Status: AC
  Filled 2014-04-17: qty 1

## 2014-04-17 MED ORDER — KETOROLAC TROMETHAMINE 30 MG/ML IJ SOLN
30.0000 mg | Freq: Once | INTRAMUSCULAR | Status: AC | PRN
  Administered 2014-04-17: 30 mg via INTRAVENOUS

## 2014-04-17 MED ORDER — NEOSTIGMINE METHYLSULFATE 10 MG/10ML IV SOLN
INTRAVENOUS | Status: AC
  Filled 2014-04-17: qty 1

## 2014-04-17 MED ORDER — HYDROMORPHONE 0.3 MG/ML IV SOLN
INTRAVENOUS | Status: DC
  Administered 2014-04-17: 10 mg via INTRAVENOUS
  Administered 2014-04-17: 11:00:00 via INTRAVENOUS
  Administered 2014-04-17: 4.8 mg via INTRAVENOUS
  Administered 2014-04-17: 6 mg via INTRAVENOUS
  Administered 2014-04-18: 1.43 mg via INTRAVENOUS
  Administered 2014-04-18: 2.4 mg via INTRAVENOUS
  Filled 2014-04-17 (×2): qty 25

## 2014-04-17 MED ORDER — OXYCODONE-ACETAMINOPHEN 5-325 MG PO TABS
1.0000 | ORAL_TABLET | ORAL | Status: DC | PRN
  Administered 2014-04-18: 1 via ORAL
  Administered 2014-04-18 – 2014-04-19 (×4): 2 via ORAL
  Filled 2014-04-17 (×2): qty 2
  Filled 2014-04-17 (×2): qty 1
  Filled 2014-04-17 (×2): qty 2

## 2014-04-17 MED ORDER — METOCLOPRAMIDE HCL 5 MG/ML IJ SOLN
INTRAMUSCULAR | Status: DC | PRN
  Administered 2014-04-17: 10 mg via INTRAVENOUS

## 2014-04-17 MED ORDER — LIDOCAINE HCL (CARDIAC) 20 MG/ML IV SOLN
INTRAVENOUS | Status: AC
  Filled 2014-04-17: qty 5

## 2014-04-17 MED ORDER — ESTRADIOL 0.1 MG/GM VA CREA
TOPICAL_CREAM | VAGINAL | Status: DC | PRN
  Administered 2014-04-17: 1 via VAGINAL

## 2014-04-17 MED ORDER — ONDANSETRON HCL 4 MG/2ML IJ SOLN
INTRAMUSCULAR | Status: DC | PRN
  Administered 2014-04-17: 4 mg via INTRAVENOUS

## 2014-04-17 MED ORDER — MIDAZOLAM HCL 2 MG/2ML IJ SOLN
INTRAMUSCULAR | Status: AC
Start: 2014-04-17 — End: 2014-04-17
  Filled 2014-04-17: qty 2

## 2014-04-17 MED ORDER — PROPOFOL 10 MG/ML IV BOLUS
INTRAVENOUS | Status: DC | PRN
  Administered 2014-04-17: 150 mg via INTRAVENOUS
  Administered 2014-04-17: 20 mg via INTRAVENOUS

## 2014-04-17 MED ORDER — EPHEDRINE 5 MG/ML INJ
INTRAVENOUS | Status: AC
  Filled 2014-04-17: qty 10

## 2014-04-17 MED ORDER — SCOPOLAMINE 1 MG/3DAYS TD PT72
1.0000 | MEDICATED_PATCH | Freq: Once | TRANSDERMAL | Status: DC
  Administered 2014-04-17: 1.5 mg via TRANSDERMAL

## 2014-04-17 MED ORDER — MIDAZOLAM HCL 5 MG/5ML IJ SOLN
INTRAMUSCULAR | Status: DC | PRN
  Administered 2014-04-17: 2 mg via INTRAVENOUS

## 2014-04-17 MED ORDER — FENTANYL CITRATE 0.05 MG/ML IJ SOLN
INTRAMUSCULAR | Status: DC | PRN
  Administered 2014-04-17: 50 ug via INTRAVENOUS
  Administered 2014-04-17 (×2): 100 ug via INTRAVENOUS
  Administered 2014-04-17 (×2): 50 ug via INTRAVENOUS

## 2014-04-17 MED ORDER — SCOPOLAMINE 1 MG/3DAYS TD PT72
MEDICATED_PATCH | TRANSDERMAL | Status: AC
  Filled 2014-04-17: qty 1

## 2014-04-17 MED ORDER — ESTRADIOL 0.1 MG/GM VA CREA
TOPICAL_CREAM | VAGINAL | Status: AC
  Filled 2014-04-17: qty 42.5

## 2014-04-17 MED ORDER — DIPHENHYDRAMINE HCL 50 MG/ML IJ SOLN: 12.5000 mg | Freq: Four times a day (QID) | INTRAMUSCULAR | Status: DC | PRN

## 2014-04-17 MED ORDER — ROCURONIUM BROMIDE 100 MG/10ML IV SOLN
INTRAVENOUS | Status: DC | PRN
  Administered 2014-04-17: 40 mg via INTRAVENOUS

## 2014-04-17 MED ORDER — MENTHOL 3 MG MT LOZG: 1.0000 | LOZENGE | OROMUCOSAL | Status: DC | PRN

## 2014-04-17 MED ORDER — METOCLOPRAMIDE HCL 5 MG/ML IJ SOLN
INTRAMUSCULAR | Status: AC
  Filled 2014-04-17: qty 2

## 2014-04-17 MED ORDER — SODIUM CHLORIDE 0.9 % IJ SOLN: 9.0000 mL | INTRAMUSCULAR | Status: DC | PRN

## 2014-04-17 MED ORDER — MEPERIDINE HCL 25 MG/ML IJ SOLN: 6.2500 mg | INTRAMUSCULAR | Status: DC | PRN

## 2014-04-17 MED ORDER — FENTANYL CITRATE 0.05 MG/ML IJ SOLN
INTRAMUSCULAR | Status: AC
  Filled 2014-04-17: qty 2

## 2014-04-17 MED ORDER — CEFAZOLIN SODIUM-DEXTROSE 2-3 GM-% IV SOLR
INTRAVENOUS | Status: AC
  Filled 2014-04-17: qty 50

## 2014-04-17 MED ORDER — EPHEDRINE SULFATE 50 MG/ML IJ SOLN
INTRAMUSCULAR | Status: DC | PRN
  Administered 2014-04-17: 5 mg via INTRAVENOUS

## 2014-04-17 MED ORDER — MIDAZOLAM HCL 2 MG/2ML IJ SOLN
0.5000 mg | Freq: Once | INTRAMUSCULAR | Status: DC | PRN
Start: 2014-04-17 — End: 2014-04-17

## 2014-04-17 MED ORDER — NALOXONE HCL 0.4 MG/ML IJ SOLN: 0.4000 mg | INTRAMUSCULAR | Status: DC | PRN

## 2014-04-17 SURGICAL SUPPLY — 30 items
CANISTER SUCT 3000ML (MISCELLANEOUS) ×2 IMPLANT
CLOTH BEACON ORANGE TIMEOUT ST (SAFETY) ×2 IMPLANT
CONT PATH 16OZ SNAP LID 3702 (MISCELLANEOUS) IMPLANT
DECANTER SPIKE VIAL GLASS SM (MISCELLANEOUS) IMPLANT
DRSG TELFA 3X8 NADH (GAUZE/BANDAGES/DRESSINGS) ×2 IMPLANT
GAUZE PACKING 1 X5 YD ST (GAUZE/BANDAGES/DRESSINGS) ×2 IMPLANT
GAUZE PACKING 2X5 YD STRL (GAUZE/BANDAGES/DRESSINGS) IMPLANT
GLOVE BIOGEL PI IND STRL 6.5 (GLOVE) ×1 IMPLANT
GLOVE BIOGEL PI IND STRL 7.0 (GLOVE) ×1 IMPLANT
GLOVE BIOGEL PI INDICATOR 6.5 (GLOVE) ×1
GLOVE BIOGEL PI INDICATOR 7.0 (GLOVE) ×1
GLOVE ECLIPSE 7.0 STRL STRAW (GLOVE) ×4 IMPLANT
GOWN STRL REUS W/TWL LRG LVL3 (GOWN DISPOSABLE) ×10 IMPLANT
NEEDLE HYPO 22GX1.5 SAFETY (NEEDLE) IMPLANT
NEEDLE SPNL 18GX3.5 QUINCKE PK (NEEDLE) ×2 IMPLANT
NS IRRIG 1000ML POUR BTL (IV SOLUTION) ×2 IMPLANT
PACK VAGINAL WOMENS (CUSTOM PROCEDURE TRAY) ×2 IMPLANT
PAD OB MATERNITY 4.3X12.25 (PERSONAL CARE ITEMS) ×2 IMPLANT
SET CYSTO W/LG BORE CLAMP LF (SET/KITS/TRAYS/PACK) ×2 IMPLANT
SUT MON AB 2-0 SH 27 (SUTURE) ×2
SUT MON AB 2-0 SH27 (SUTURE) ×2 IMPLANT
SUT VIC AB 0 CT1 18XCR BRD8 (SUTURE) ×3 IMPLANT
SUT VIC AB 0 CT1 27 (SUTURE) ×2
SUT VIC AB 0 CT1 27XBRD ANBCTR (SUTURE) ×2 IMPLANT
SUT VIC AB 0 CT1 8-18 (SUTURE) ×3
SUT VICRYL 0 TIES 12 18 (SUTURE) ×2 IMPLANT
SYR 20CC LL (SYRINGE) ×2 IMPLANT
TOWEL OR 17X24 6PK STRL BLUE (TOWEL DISPOSABLE) ×4 IMPLANT
TRAY FOLEY CATH 14FR (SET/KITS/TRAYS/PACK) ×2 IMPLANT
WATER STERILE IRR 1000ML POUR (IV SOLUTION) ×2 IMPLANT

## 2014-04-17 NOTE — Anesthesia Preprocedure Evaluation (Signed)
Anesthesia Evaluation  Patient identified by MRN, date of birth, ID band Patient awake    Reviewed: Allergy & Precautions, H&P , Patient's Chart, lab work & pertinent test results, reviewed documented beta blocker date and time   History of Anesthesia Complications (+) PONV and history of anesthetic complications  Airway Mallampati: II  TM Distance: >3 FB Neck ROM: full    Dental   Pulmonary  breath sounds clear to auscultation        Cardiovascular Exercise Tolerance: Good Rhythm:regular Rate:Normal     Neuro/Psych  Headaches,    GI/Hepatic   Endo/Other    Renal/GU      Musculoskeletal   Abdominal   Peds  Hematology   Anesthesia Other Findings Difficult spinal  Reproductive/Obstetrics                             Anesthesia Physical Anesthesia Plan  ASA: II  Anesthesia Plan: General ETT   Post-op Pain Management:    Induction:   Airway Management Planned:   Additional Equipment:   Intra-op Plan:   Post-operative Plan:   Informed Consent: I have reviewed the patients History and Physical, chart, labs and discussed the procedure including the risks, benefits and alternatives for the proposed anesthesia with the patient or authorized representative who has indicated his/her understanding and acceptance.   Dental Advisory Given  Plan Discussed with: CRNA and Surgeon  Anesthesia Plan Comments:         Anesthesia Quick Evaluation

## 2014-04-17 NOTE — Op Note (Signed)
Preoperative diagnosis: Menorrhagia, Dysmenorrhea, Cervical stenosis  Postoperative diagnosis: Same  Procedure: Transvaginal hysterectomy, cystotomy repair and cystoscopy   Surgeon: Standley Dakins. Kennon Rounds, M.D.  Assistant: Mora Bellman, MD  Anesthesia: General Rosanne Gutting, MD  Findings: Normal appearing uterus adherent bladder to cervix and adherent tubes to ovaries, Normal appearing ovaries.  Estimated blood loss: 150 cc  Specimen: Uterus and cervix to pathology  Reason for procedure: Patient had long h/o bleeding and pelvic pain.  Had failed attempt at endometrial biopsy and failed attempt at D and C with endometrial ablation. The patient desired definitive treatment.  Risks of  hysterectomy reviewed.  Risks include but are not limited to bleeding, infection, injury to surrounding structures, including bowel, bladder and ureters, blood clots, and death.  Likelihood of success of surgery is high.   Procedure: Patient was taken to the OR where she was placed in dorsal lithotomy in [place]. She was prepped and draped in the usual sterile fashion. A timeout was performed. The patient received 2g of Ancef prior to procedure. The patient had SCDs in place.  A speculum was placed inside the vagina. The cervix was visualized and grasped with 2 doublle-tooth tenacula. 40 cc of 1% lidocaine with epinephrine were injected paracervically. A knife was used to make a circumferential incision around the vagina. An opened sponge was used to dissect the vagina off the cervix. The posterior peritoneum was entered sharply with Mayo scissors. The posterior peritoneum was tagged to the vaginal cuff with a single stitch. The anterior peritoneal cavity could not be entered and the bladder was adherent to underlying cervix. A Heaney clamp was used to clamp first the left uterosacral ligament and cardinal which was then cut and Haney suture ligated with 0 Vicryl stitch, the stitch was held. Similarly the right  uterosacral ligament was clamped cut and suture ligated. The bladder was pushed up and off the cervix. Sequential bites up the broad to the uterine arteries were taken until the tubo-ovarian pedicles were encountered. The uterus was then inverted and the left utero-ovarian pedicle grasped with a Heaney clamp. The right utero-ovarian pedicle was similarly grasped with the Heaney clamp and suture ligated. The right and left ovaries were easily visualized. The tubes were adherent to the ovaries and further attempts at removal would have led to further bleeding and so attempted salpingectomy was abandoned.  Pedicles were noted to be hemostatic. At this point some fluid was noted and inspection of the bladder noted a 2.5 cm cystotomy. The angles were identified and the bladder closed with 2-0 Monocryl in 2 layers.  Cystoscopy was performed with both ureteral orifices noted and leakage of urine from both ureters.  Bladder repair was above this.  The repair was tight without leakage of urine. The vagina was closed with 0 Vicryl suture in a locked running fashion with care taken to incorporate the uterosacral pedicles.One figure of eight was placed at the middle of the cuff. Excellent hemostasis was noted at the end of the case.  A Foley catheter is placed inside her bladder. Clear, yellow urine was noted. A 1 inch vaginal packing with Estrace was placed in the vagina. All instrument needle and lap counts were correct x 2. Patient was awakened taken to recovery room in stable condition.  Donnamae Jude, MD 04/17/2014, 9:18 AM

## 2014-04-17 NOTE — Anesthesia Postprocedure Evaluation (Signed)
  Anesthesia Post-op Note  Patient: Dawn Spencer  Procedure(s) Performed: Procedure(s): HYSTERECTOMY VAGINAL / CYSTOSCOPY (N/A) Patient is awake and responsive. Pain and nausea are reasonably well controlled. Vital signs are stable and clinically acceptable. Oxygen saturation is clinically acceptable. There are no apparent anesthetic complications at this time. Patient is ready for discharge.

## 2014-04-17 NOTE — Transfer of Care (Signed)
Immediate Anesthesia Transfer of Care Note  Patient: Dawn Spencer  Procedure(s) Performed: Procedure(s): HYSTERECTOMY VAGINAL / CYSTOSCOPY (N/A)  Patient Location: PACU  Anesthesia Type:General  Level of Consciousness: awake  Airway & Oxygen Therapy: Patient Spontanous Breathing and Patient connected to nasal cannula oxygen  Post-op Assessment: Report given to PACU RN and Post -op Vital signs reviewed and stable  Post vital signs: stable  Complications: No apparent anesthesia complications

## 2014-04-17 NOTE — Interval H&P Note (Signed)
History and Physical Interval Note:  04/17/2014 7:14 AM  Dawn Spencer  has presented today for surgery, with the diagnosis of Heavy vaginal bleeding  The various methods of treatment have been discussed with the patient and family. After consideration of risks, benefits and other options for treatment, the patient has consented to  Procedure(s): HYSTERECTOMY VAGINAL (N/A) and bilateral salpingectomy as a surgical intervention .  The patient's history has been reviewed, patient examined, no change in status, stable for surgery.  I have reviewed the patient's chart and labs.  Questions were answered to the patient's satisfaction.     [place]

## 2014-04-17 NOTE — Addendum Note (Signed)
Addendum  created 04/17/14 1253 by Ignacia Bayley, CRNA   Modules edited: Notes Section   Notes Section:  File: [id number]

## 2014-04-17 NOTE — Anesthesia Postprocedure Evaluation (Signed)
  Anesthesia Post-op Note  Patient: Dawn Spencer  Procedure(s) Performed: Procedure(s): HYSTERECTOMY VAGINAL / CYSTOSCOPY (N/A)  Patient Location: [facility]  Anesthesia Type:General  Level of Consciousness: awake  Airway and Oxygen Therapy: Patient Spontanous Breathing and Patient connected to nasal cannula oxygen  Post-op Pain: mild  Post-op Assessment: Patient's Cardiovascular Status Stable and Respiratory Function Stable  Post-op Vital Signs: stable  Last Vitals:  Filed Vitals:   04/17/14 1215  BP: 119/79  Pulse: 72  Temp: 36.4 C  Resp: 20    Complications: No apparent anesthesia complications

## 2014-04-18 ENCOUNTER — Encounter (HOSPITAL_COMMUNITY): Payer: Self-pay | Admitting: Family Medicine

## 2014-04-18 DIAGNOSIS — N920 Excessive and frequent menstruation with regular cycle: Secondary | ICD-10-CM | POA: Diagnosis not present

## 2014-04-18 LAB — CBC
HCT: 33.3 % — ABNORMAL LOW (ref 36.0–46.0)
HEMOGLOBIN: 10.7 g/dL — AB (ref 12.0–15.0)
MCH: 30 pg (ref 26.0–34.0)
MCHC: 32.1 g/dL (ref 30.0–36.0)
MCV: 93.3 fL (ref 78.0–100.0)
Platelets: 304 10*3/uL (ref 150–400)
RBC: 3.57 MIL/uL — ABNORMAL LOW (ref 3.87–5.11)
RDW: 15 % (ref 11.5–15.5)
WBC: 11.7 10*3/uL — AB (ref 4.0–10.5)

## 2014-04-18 MED ORDER — OXYCODONE-ACETAMINOPHEN 5-325 MG PO TABS: 1.0000 | ORAL_TABLET | ORAL | Status: DC | PRN

## 2014-04-18 MED ORDER — SUMATRIPTAN SUCCINATE 50 MG PO TABS
50.0000 mg | ORAL_TABLET | Freq: Four times a day (QID) | ORAL | Status: DC | PRN
  Administered 2014-04-18 – 2014-04-19 (×2): 50 mg via ORAL
  Filled 2014-04-18 (×4): qty 1

## 2014-04-18 NOTE — Progress Notes (Signed)
1 Day Post-Op Procedure(s) (LRB): HYSTERECTOMY VAGINAL / CYSTOSCOPY (N/A)  Subjective: Patient reports nausea. No flatus.  Did not sleep well.    Objective: I have reviewed patient's vital signs, intake and output, medications and labs.  General: alert, cooperative and appears stated age Resp: normal effort Cardio: regular rate and rhythm GI: soft, non-tender; bowel sounds normal; no masses,  no organomegaly Extremities: extremities normal, atraumatic, no cyanosis or edema Vaginal Bleeding: none  Assessment: s/p Procedure(s): HYSTERECTOMY VAGINAL / CYSTOSCOPY (N/A): stable  Plan: Advance diet Encourage ambulation Advance to PO medication Discontinue IV fluids  LOS: 1 day    Areil Ottey S 04/18/2014, 9:51 AM

## 2014-04-18 NOTE — Discharge Instructions (Signed)
Vaginal Hysterectomy, Care After Refer to this sheet in the next few weeks. These instructions provide you with information on caring for yourself after your procedure. Your health care provider may also give you more specific instructions. Your treatment has been planned according to current medical practices, but problems sometimes occur. Call your health care provider if you have any problems or questions after your procedure.  WHAT TO EXPECT AFTER THE PROCEDURE After your procedure, it is typical to have the following:  Pain.  Feeling tired.  Poor appetite.  Less interest in sex. HOME CARE INSTRUCTIONS  It takes 4-6 weeks to recover from this surgery. Make sure you follow all your health care provider's instructions. Home care instructions may include:  Take pain medicines only as directed by your health care provider. Do not take over-the-counter pain medicines without checking with your health care provider first.  Return to your health care provider to have your sutures taken out.  Take showers instead of baths for 2-3 weeks. Ask your health care provider when it is safe to start showering.  Do not douche, use tampons, or have sexual intercourse for at least 6 weeks or until your health care provider says you can.   Follow your health care provider's advice about exercise, lifting, driving, and general activities.  Get plenty of rest and sleep.   Do not lift anything heavier than a gallon of milk (about 10 lb [4.5 kg]) for the first month after surgery.  You can resume your normal diet if your health care provider says it is okay.   Do not drink alcohol until your health care provider says you can.   If you are constipated, ask your health care provider if you can take a mild laxative.  Eating foods high in fiber may also help with constipation. Eat plenty of raw fruits and vegetables, whole grains, and beans.  Drink enough fluids to keep your urine clear or pale  yellow.   Try to have someone at home with you for the first 1-2 weeks to help around the house.  Keep all follow-up appointments. SEEK MEDICAL CARE IF:   You have chills or fever.  You have swelling, redness, or pain in the area of your incision that is getting worse.   You have pus coming from the incision.   You notice a bad smell coming from the incision or bandage.   Your incision breaks open.   You feel dizzy or light-headed.   You have pain or bleeding when you urinate.   You have persistent diarrhea.   You have persistent nausea and vomiting.   You have abnormal vaginal discharge.   You have a rash.   You have any type of abnormal reaction or develop an allergy to your medicine.   Your pain medicine is not helping.  SEEK IMMEDIATE MEDICAL CARE IF:   You have a fever and your symptoms suddenly get worse.  You have severe abdominal pain.  You have chest pain.  You have shortness of breath.  You faint.  You have pain, swelling, or redness of your leg.  You have heavy vaginal bleeding with blood clots. MAKE SURE YOU:  Understand these instructions.  Will watch your condition.  Will get help right away if you are not doing well or get worse. Document Released: 10/24/2004 Document Revised: 04/11/2013 Document Reviewed: 01/27/2013 [facility] Patient Information 2015 [place], [place]. This information is not intended to replace advice given to you by your health care provider.  Make sure you discuss any questions you have with your health care provider. ° °

## 2014-04-19 DIAGNOSIS — N920 Excessive and frequent menstruation with regular cycle: Secondary | ICD-10-CM | POA: Diagnosis not present

## 2014-04-19 NOTE — Progress Notes (Signed)
Pt discharged home with husband... Discharge instructions reviewed with pt and she verbalized understanding... Condition stable... No equipment.Marland KitchenMarland KitchenTaken to car via wheelchair by C. Ovid Curd, [place].

## 2014-04-19 NOTE — Discharge Summary (Signed)
Physician Discharge Summary  Patient ID: DNIYA NEUHAUS MRN: [id number] DOB/AGE: 1965/12/17 48 y.o.  Admit date: 04/17/2014 Discharge date:   Admission Diagnoses:  Principal Problem:   Heavy menstrual bleeding Active Problems:   Cervical stenosis (uterine cervix)   S/P hysterectomy   DUB (dysfunctional uterine bleeding)   Discharge Diagnoses:  Same  Past Medical History  Diagnosis Date  . Generalized headaches   . History of insomnia   . Bleeding from nipple in female 08/25/07  . Complication of anesthesia 1997    difficult spinal   . PONV (postoperative nausea and vomiting)     Surgeries: Procedure(s): HYSTERECTOMY VAGINAL/Cystotomy repair/CYSTOSCOPY on 04/17/2014   Discharged Condition: Improved  Hospital Course: Dawn Spencer is an 48 y.o. female G3P3 who was admitted 04/17/2014 , and found to have a diagnosis of Heavy menstrual bleeding.  She was brought to the operating room on 04/17/2014 and underwent the above named procedures.    She was given perioperative antibiotics:  Anti-infectives    Start     Dose/Rate Route Frequency Ordered Stop   04/17/14 0633  ceFAZolin (ANCEF) 2-3 GM-% IVPB SOLR    Comments:  Harvell, Gwendolyn  : cabinet override      04/17/14 0633 04/17/14 1844   04/17/14 0100  ceFAZolin (ANCEF) IVPB 2 g/50 mL premix     2 g100 mL/hr over 30 Minutes Intravenous On call to O.R. 04/17/14 0100 04/17/14 0725      She was given sequential compression devices, early ambulation.  She benefited maximally from their hospital stay and there were no complications. She remained afebrile, was tolerating po, with minimal nausea, passing flatus.  She had stable vital signs.  She continued her foley post discharge, until 7-10 days post-operatively.  Recent vital signs:  Filed Vitals:   04/19/14 1200  BP: 121/56  Pulse: 61  Temp: 98.8 F (37.1 C)  Resp: 16  Physical Examination: General appearance - alert, well appearing, and in no  distress Abdomen - soft, nontender, nondistended, no masses or organomegaly Extremities - peripheral pulses normal, no pedal edema, no clubbing or cyanosis Skin - normal coloration and turgor, no rashes, no suspicious skin lesions noted   Recent laboratory studies:  Results for orders placed or performed during the hospital encounter of 04/17/14  CBC  Result Value Ref Range   WBC 11.7 (H) 4.0 - 10.5 K/uL   RBC 3.57 (L) 3.87 - 5.11 MIL/uL   Hemoglobin 10.7 (L) 12.0 - 15.0 g/dL   HCT 33.3 (L) 36.0 - 46.0 %   MCV 93.3 78.0 - 100.0 fL   MCH 30.0 26.0 - 34.0 pg   MCHC 32.1 30.0 - 36.0 g/dL   RDW 15.0 11.5 - 15.5 %   Platelets 304 150 - 400 K/uL    Discharge Medications:     Medication List    STOP taking these medications        megestrol 40 MG tablet  Commonly known as:  MEGACE      TAKE these medications        calcium-vitamin D 500-200 MG-UNIT per tablet  Commonly known as:  OSCAL 500/200 D-3  Take 1 tablet by mouth 2 (two) times daily.     HAIR/SKIN/NAILS PO  Take 2 tablets by mouth daily.     ondansetron 8 MG disintegrating tablet  Commonly known as:  ZOFRAN ODT  Take 1 tablet (8 mg total) by mouth every 8 (eight) hours as needed for nausea or vomiting.  oxyCODONE-acetaminophen 5-325 MG per tablet  Commonly known as:  PERCOCET/ROXICET  Take 1-2 tablets by mouth every 4 (four) hours as needed for severe pain (moderate to severe pain (when tolerating fluids)).     promethazine 25 MG tablet  Commonly known as:  PHENERGAN  Take 1 tablet (25 mg total) by mouth every 6 (six) hours as needed for nausea or vomiting.     SUMAtriptan 100 MG tablet  Commonly known as:  IMITREX  Take 1 tablet (100 mg total) by mouth once as needed for migraine or headache. May repeat in 2 hours if headache persists or recurs.     zolpidem 10 MG tablet  Commonly known as:  AMBIEN  Take 1 tablet (10 mg total) by mouth at bedtime as needed for sleep.        Diagnostic Studies: No  results found.  Disposition: 01-Home or Self Care      Discharge Instructions    Call MD for:  difficulty breathing, headache or visual disturbances    Complete by:  As directed      Call MD for:  persistant nausea and vomiting    Complete by:  As directed      Call MD for:  redness, tenderness, or signs of infection (pain, swelling, redness, odor or green/yellow discharge around incision site)    Complete by:  As directed      Call MD for:  severe uncontrolled pain    Complete by:  As directed      Call MD for:  temperature >100.4    Complete by:  As directed      Diet - low sodium heart healthy    Complete by:  As directed      Increase activity slowly    Complete by:  As directed      Increase activity slowly    Complete by:  As directed      Lifting restrictions    Complete by:  As directed   Nothing > 20 lbs for 3-4 wks     No wound care    Complete by:  As directed      Sexual Activity Restrictions    Complete by:  As directed   None x 6 wks     Urinary leg bag    Complete by:  As directed            Follow-up Information    Follow up with [facility] In 1 week.   Specialty:  Obstetrics and Gynecology   Why:  voiding trial   Contact information:   [place] [phone]       Signed: Donnamae Jude 04/19/2014, 12:49 PM

## 2014-04-25 ENCOUNTER — Ambulatory Visit (INDEPENDENT_AMBULATORY_CARE_PROVIDER_SITE_OTHER): Payer: Self-pay | Admitting: *Deleted

## 2014-04-25 DIAGNOSIS — Z9889 Other specified postprocedural states: Secondary | ICD-10-CM

## 2014-04-25 NOTE — Progress Notes (Signed)
Catheter was removed without issue and patient was given water to drink and sat in the waiting room for approximately 40 minutes.  She voided without problem and was advised to go to the bathroom every two hours whether she felt the need to urinate or not.  She will call if she feels like she needs to urinate and is unable to and will go to the ER if this happens during the middle of the night and she feels as if her bladder is not emptying as it should.  We have made a 4 week post op visit with Dr. Kennon Rounds and she will call if she needs to be seen sooner than 4 weeks.

## 2014-04-26 ENCOUNTER — Other Ambulatory Visit: Payer: Self-pay | Admitting: *Deleted

## 2014-04-26 DIAGNOSIS — G43901 Migraine, unspecified, not intractable, with status migrainosus: Secondary | ICD-10-CM

## 2014-04-26 MED ORDER — PROMETHAZINE HCL 25 MG PO TABS: 25.0000 mg | ORAL_TABLET | Freq: Four times a day (QID) | ORAL | Status: DC | PRN

## 2014-04-26 NOTE — Telephone Encounter (Signed)
Patient is requesting a refill of phenergan.

## 2014-05-17 ENCOUNTER — Encounter: Payer: Self-pay | Admitting: Family Medicine

## 2014-05-29 ENCOUNTER — Encounter: Payer: Self-pay | Admitting: Family Medicine

## 2014-06-13 ENCOUNTER — Encounter: Payer: Self-pay | Admitting: Family Medicine

## 2014-06-13 ENCOUNTER — Ambulatory Visit (INDEPENDENT_AMBULATORY_CARE_PROVIDER_SITE_OTHER): Payer: 59 | Admitting: Family Medicine

## 2014-06-13 VITALS — BP 117/83 | HR 70 | Ht 60.0 in | Wt 195.0 lb

## 2014-06-13 DIAGNOSIS — G43009 Migraine without aura, not intractable, without status migrainosus: Secondary | ICD-10-CM

## 2014-06-13 DIAGNOSIS — Z9889 Other specified postprocedural states: Secondary | ICD-10-CM

## 2014-06-13 DIAGNOSIS — R3589 Other polyuria: Secondary | ICD-10-CM

## 2014-06-13 DIAGNOSIS — R358 Other polyuria: Secondary | ICD-10-CM

## 2014-06-13 LAB — POCT URINALYSIS DIPSTICK
Bilirubin, UA: NEGATIVE
Glucose, UA: NEGATIVE
Ketones, UA: NEGATIVE
Nitrite, UA: NEGATIVE
Protein, UA: NEGATIVE
Spec Grav, UA: 1.01
UROBILINOGEN UA: NEGATIVE
pH, UA: 5

## 2014-06-13 MED ORDER — NITROFURANTOIN MONOHYD MACRO 100 MG PO CAPS: 100.0000 mg | ORAL_CAPSULE | Freq: Two times a day (BID) | ORAL | Status: DC

## 2014-06-13 MED ORDER — SUMATRIPTAN SUCCINATE 6 MG/0.5ML ~~LOC~~ SOAJ: 1.0000 | SUBCUTANEOUS | Status: DC | PRN

## 2014-06-13 NOTE — Patient Instructions (Signed)

## 2014-06-13 NOTE — Progress Notes (Signed)
Post op, doing well except increased urination has become an issue.  She has to go several times a day and throughout the night as well.  She has tried decreasing her fluids but it has not made a difference.  She has also had a 20 pound weight gain since December 29th.

## 2014-06-13 NOTE — Progress Notes (Signed)
    Subjective:    Patient ID: Dawn Spencer is a 49 y.o. female presenting with Routine Post Op  on 06/13/2014  HPI: Here for post op check.  TVH with incidental cystotomy 12/29--cathter removal 04/25/14.  Reports frequent urinatio since catheter removal.  Notes 1x/hour.  She is holding her water well and notes no leakage. Getting up 4-5 times/night to urinate. No dysuria, no hematuria. No fever, hills, no abdominal pain. Has not resumed intercourse.  Review of Systems  Constitutional: Negative for fever and chills.  Respiratory: Negative for shortness of breath.   Cardiovascular: Negative for chest pain.  Gastrointestinal: Negative for nausea, vomiting and abdominal pain.  Genitourinary: Negative for dysuria.  Skin: Negative for rash.      Objective:    BP 117/83 mmHg  Pulse 70  Ht 5' (1.524 m)  Wt 195 lb (88.451 kg)  BMI 38.08 kg/m2  LMP 12/11/2013 Physical Exam  Constitutional: She is oriented to person, place, and time. She appears well-developed and well-nourished. No distress.  HENT:  Head: Normocephalic and atraumatic.  Eyes: No scleral icterus.  Neck: Neck supple.  Cardiovascular: Normal rate.   Pulmonary/Chest: Effort normal.  Abdominal: Soft.  Genitourinary: Vagina normal. No vaginal discharge found.  There is no leakage of urine with valsalva.  Neurological: She is alert and oriented to person, place, and time.  Skin: Skin is warm and dry.  Psychiatric: She has a normal mood and affect.   Urinalysis shows 2+ leuks and trace blood     Assessment & Plan:    Problem List Items Addressed This Visit      Unprioritized   Migraine without aura   Relevant Medications   SUMAtriptan 6 MG/0.5ML SOAJ    Other Visit Diagnoses    Polyuria    -  Primary    possibly related to low grade infection--trial of abx. If no relief, may need anti-spasm medicine. Consider check for fistula.    Relevant Medications    nitrofurantoin, macrocrystal-monohydrate, (MACROBID)  100 MG capsule    Other Relevant Orders    POCT Urinalysis Dipstick (Completed)    CULTURE, URINE COMPREHENSIVE

## 2014-06-15 LAB — CULTURE, URINE COMPREHENSIVE: Colony Count: 60000

## 2014-06-18 ENCOUNTER — Telehealth: Payer: Self-pay | Admitting: *Deleted

## 2014-06-18 DIAGNOSIS — N390 Urinary tract infection, site not specified: Secondary | ICD-10-CM

## 2014-06-18 MED ORDER — AMPICILLIN 500 MG PO CAPS: 500.0000 mg | ORAL_CAPSULE | Freq: Three times a day (TID) | ORAL | Status: DC

## 2014-06-18 NOTE — Telephone Encounter (Signed)
-----   Message from Donnamae Jude, MD sent at 06/15/2014 11:27 AM EST ----- Call pt. And see if symptoms are improving--if not change abx to ampicillin 500 mg tid x 7 d

## 2014-07-17 ENCOUNTER — Other Ambulatory Visit: Payer: Self-pay | Admitting: Family Medicine

## 2014-07-17 DIAGNOSIS — G4700 Insomnia, unspecified: Secondary | ICD-10-CM

## 2014-07-17 MED ORDER — ZOLPIDEM TARTRATE 10 MG PO TABS: 10.0000 mg | ORAL_TABLET | Freq: Every evening | ORAL | Status: DC | PRN

## 2014-07-24 ENCOUNTER — Other Ambulatory Visit: Payer: Self-pay | Admitting: Family Medicine

## 2014-07-24 NOTE — Progress Notes (Signed)
Called in medication for patient.

## 2014-07-25 ENCOUNTER — Telehealth: Payer: Self-pay | Admitting: *Deleted

## 2014-07-25 ENCOUNTER — Encounter: Payer: Self-pay | Admitting: *Deleted

## 2014-07-25 DIAGNOSIS — G43009 Migraine without aura, not intractable, without status migrainosus: Secondary | ICD-10-CM

## 2014-07-25 MED ORDER — SUMATRIPTAN SUCCINATE 6 MG/0.5ML ~~LOC~~ SOAJ: 1.0000 | SUBCUTANEOUS | Status: DC | PRN

## 2014-07-25 NOTE — Telephone Encounter (Signed)
Patient needs refill of Imitrex injectable.

## 2014-07-26 NOTE — Telephone Encounter (Signed)
Called in RX per Dr. Tobe Sos

## 2014-08-06 ENCOUNTER — Other Ambulatory Visit: Payer: Self-pay | Admitting: *Deleted

## 2014-08-06 DIAGNOSIS — R110 Nausea: Secondary | ICD-10-CM

## 2014-08-06 DIAGNOSIS — G43009 Migraine without aura, not intractable, without status migrainosus: Secondary | ICD-10-CM

## 2014-08-06 MED ORDER — SUMATRIPTAN SUCCINATE 6 MG/0.5ML ~~LOC~~ SOAJ: 1.0000 | SUBCUTANEOUS | Status: DC | PRN

## 2014-08-06 MED ORDER — ONDANSETRON 8 MG PO TBDP: 8.0000 mg | ORAL_TABLET | Freq: Three times a day (TID) | ORAL | Status: DC | PRN

## 2014-08-06 NOTE — Telephone Encounter (Signed)
Pharmacy sent request for refill of imitrex injectable and zofran.  Refills authorized.

## 2014-09-04 ENCOUNTER — Other Ambulatory Visit: Payer: BC Managed Care – PPO

## 2014-09-11 ENCOUNTER — Encounter: Payer: BC Managed Care – PPO | Admitting: Family Medicine

## 2014-09-11 DIAGNOSIS — E28310 Symptomatic premature menopause: Secondary | ICD-10-CM

## 2014-09-11 NOTE — Progress Notes (Deleted)
    Subjective:    Patient ID: Dawn Spencer is a 49 y.o. female presenting with No chief complaint on file.  on 09/11/2014  HPI: ***  Review of Systems    Objective:    LMP 12/11/2013 Physical Exam      Assessment & Plan:    No Follow-up on file.  Kirstan Fentress S 09/11/2014 4:22 PM

## 2014-09-11 NOTE — Progress Notes (Signed)
This encounter was created in error - please disregard.

## 2014-09-12 ENCOUNTER — Encounter: Payer: Self-pay | Admitting: Obstetrics & Gynecology

## 2014-09-26 ENCOUNTER — Encounter: Payer: BC Managed Care – PPO | Admitting: Obstetrics and Gynecology

## 2014-10-30 ENCOUNTER — Telehealth: Payer: Self-pay | Admitting: *Deleted

## 2014-10-30 DIAGNOSIS — G43911 Migraine, unspecified, intractable, with status migrainosus: Secondary | ICD-10-CM

## 2014-10-30 MED ORDER — "INSULIN SYRINGE-NEEDLE U-100 31G X 5/16"" 0.5 ML MISC": 2.0000 | Status: DC | PRN

## 2014-10-30 MED ORDER — SUMATRIPTAN SUCCINATE 6 MG/0.5ML ~~LOC~~ SOLN: 6.0000 mg | SUBCUTANEOUS | Status: DC | PRN

## 2014-10-30 NOTE — Telephone Encounter (Signed)
Changed type of vial imitrex is dispensed in per pt request.

## 2015-01-10 ENCOUNTER — Encounter: Payer: Self-pay | Admitting: Family Medicine

## 2015-01-10 ENCOUNTER — Ambulatory Visit (INDEPENDENT_AMBULATORY_CARE_PROVIDER_SITE_OTHER): Payer: BC Managed Care – PPO | Admitting: Family Medicine

## 2015-01-10 VITALS — BP 125/83 | HR 66 | Ht 60.0 in | Wt 195.0 lb

## 2015-01-10 DIAGNOSIS — A58 Granuloma inguinale: Secondary | ICD-10-CM

## 2015-01-10 DIAGNOSIS — G43909 Migraine, unspecified, not intractable, without status migrainosus: Secondary | ICD-10-CM

## 2015-01-10 DIAGNOSIS — N899 Noninflammatory disorder of vagina, unspecified: Secondary | ICD-10-CM | POA: Diagnosis not present

## 2015-01-10 DIAGNOSIS — N898 Other specified noninflammatory disorders of vagina: Secondary | ICD-10-CM

## 2015-01-10 MED ORDER — SUMATRIPTAN SUCCINATE 100 MG PO TABS: 100.0000 mg | ORAL_TABLET | Freq: Once | ORAL | Status: DC | PRN

## 2015-01-10 NOTE — Patient Instructions (Signed)

## 2015-01-10 NOTE — Progress Notes (Signed)
    Subjective:    Patient ID: Dawn Spencer is a 49 y.o. female presenting with Vaginal Bleeding  on 01/10/2015  HPI: Reports 3rd episode of vaginal spotting/bleeding since hysterectomy on 12/15. She has some vaginal discharge. No pain or fever.  Urinary symptoms have subsided.  Review of Systems  Constitutional: Negative for fever and chills.  Respiratory: Negative for shortness of breath.   Cardiovascular: Negative for chest pain.  Gastrointestinal: Negative for nausea, vomiting and abdominal pain.  Genitourinary: Positive for vaginal bleeding. Negative for dysuria.  Skin: Negative for rash.      Objective:    BP 143/87 mmHg  Pulse 66  Ht 5' (1.524 m)  Wt 195 lb (88.451 kg)  BMI 38.08 kg/m2  LMP 12/11/2013 Physical Exam  Constitutional: She appears well-developed and well-nourished. No distress.  Cardiovascular: Normal rate.   Pulmonary/Chest: Effort normal.  Abdominal: Soft.  Genitourinary:  Small amount of granulation tissue at the top of the cuff--treated with AgNO3        Assessment & Plan:   Problem List Items Addressed This Visit      Unprioritized   Granulation tissue at vaginal vault    Will continue to treat q 1-2 wks until it is gone.       Other Visit Diagnoses    Migraine without status migrainosus, not intractable, unspecified migraine type    -  Primary    Relevant Medications    SUMAtriptan (IMITREX) 100 MG tablet      Return in about 2 weeks (around 01/24/2015).   Rowen Wilmer S 01/10/2015 10:01 AM

## 2015-01-10 NOTE — Assessment & Plan Note (Signed)
Will continue to treat q 1-2 wks until it is gone.

## 2015-01-22 ENCOUNTER — Encounter: Payer: Self-pay | Admitting: Family Medicine

## 2015-01-22 ENCOUNTER — Ambulatory Visit (INDEPENDENT_AMBULATORY_CARE_PROVIDER_SITE_OTHER): Payer: BC Managed Care – PPO | Admitting: Family Medicine

## 2015-01-22 VITALS — BP 130/79 | HR 67 | Wt 193.0 lb

## 2015-01-22 DIAGNOSIS — B002 Herpesviral gingivostomatitis and pharyngotonsillitis: Secondary | ICD-10-CM

## 2015-01-22 DIAGNOSIS — A58 Granuloma inguinale: Secondary | ICD-10-CM

## 2015-01-22 DIAGNOSIS — N898 Other specified noninflammatory disorders of vagina: Secondary | ICD-10-CM

## 2015-01-22 MED ORDER — VALACYCLOVIR HCL 1 G PO TABS
1000.0000 mg | ORAL_TABLET | Freq: Every day | ORAL | Status: DC
Start: 2015-01-22 — End: 2015-09-16

## 2015-01-22 NOTE — Progress Notes (Signed)
    Subjective:    Patient ID: Dawn Spencer is a 49 y.o. female presenting with Follow-up  on 01/22/2015  HPI: Here to f/u granulation tissue at top of vagina. Doing well. 1 episode of bleeding following last treatment with AgNO3. Would like Rx for Valtrex as she is getting a fever blister.  Review of Systems  Constitutional: Negative for fever and chills.  Respiratory: Negative for shortness of breath.   Cardiovascular: Negative for chest pain.  Gastrointestinal: Negative for nausea, vomiting and abdominal pain.  Genitourinary: Negative for dysuria.  Skin: Negative for rash.      Objective:    BP 130/79 mmHg  Pulse 67  Wt 193 lb (87.544 kg)  LMP 12/11/2013 Physical Exam  Constitutional: She appears well-developed and well-nourished. No distress.  HENT:  Head: Normocephalic and atraumatic.  Cardiovascular: Normal rate.   Pulmonary/Chest: Effort normal.  Genitourinary:  Very small amount of granulation tissue noted.-Treated with AgNO3  Psychiatric: She has a normal mood and affect.        Assessment & Plan:   Problem List Items Addressed This Visit      Unprioritized   Granulation tissue at vaginal vault    S/p treatment--repeat in 2 wks.       Other Visit Diagnoses    Oral herpes    -  Primary    Relevant Medications    valACYclovir (VALTREX) 1000 MG tablet       Return in about 2 weeks (around 02/05/2015) for a follow-up.  Longino Trefz S 01/22/2015 2:16 PM

## 2015-01-22 NOTE — Assessment & Plan Note (Signed)
S/p treatment--repeat in 2 wks.

## 2015-01-29 ENCOUNTER — Encounter: Payer: BC Managed Care – PPO | Admitting: Physician Assistant

## 2015-01-29 DIAGNOSIS — R51 Headache: Secondary | ICD-10-CM

## 2015-02-04 ENCOUNTER — Encounter: Payer: Self-pay | Admitting: Family Medicine

## 2015-02-04 ENCOUNTER — Ambulatory Visit (INDEPENDENT_AMBULATORY_CARE_PROVIDER_SITE_OTHER): Payer: BC Managed Care – PPO | Admitting: Family Medicine

## 2015-02-04 VITALS — BP 120/80 | HR 68 | Resp 18 | Wt 189.0 lb

## 2015-02-04 DIAGNOSIS — Z23 Encounter for immunization: Secondary | ICD-10-CM

## 2015-02-04 DIAGNOSIS — N898 Other specified noninflammatory disorders of vagina: Secondary | ICD-10-CM

## 2015-02-04 DIAGNOSIS — N899 Noninflammatory disorder of vagina, unspecified: Secondary | ICD-10-CM | POA: Diagnosis not present

## 2015-02-04 NOTE — Assessment & Plan Note (Signed)
Resolved.--no need for further f/u.

## 2015-02-04 NOTE — Progress Notes (Signed)
    Subjective:    Patient ID: Dawn Spencer is a 49 y.o. female presenting with Follow-up  on 02/04/2015  HPI: Here for treatment of vaginal granulation tissue.  No further bleeding.  Review of Systems  Constitutional: Negative for fever and chills.  Respiratory: Negative for shortness of breath.   Cardiovascular: Negative for chest pain.  Gastrointestinal: Negative for nausea, vomiting and abdominal pain.  Genitourinary: Negative for dysuria.  Skin: Negative for rash.      Objective:    BP 120/80 mmHg  Pulse 68  Resp 18  Wt 189 lb (85.73 kg)  LMP 12/11/2013 Physical Exam  Constitutional: She is oriented to person, place, and time. She appears well-developed and well-nourished. No distress.  HENT:  Head: Normocephalic and atraumatic.  Eyes: No scleral icterus.  Neck: Neck supple.  Cardiovascular: Normal rate.   Pulmonary/Chest: Effort normal.  Abdominal: Soft.  Genitourinary:  Vaginal tissue noted.  No granulation tissue noted.  Neurological: She is alert and oriented to person, place, and time.  Skin: Skin is warm and dry.  Psychiatric: She has a normal mood and affect.        Assessment & Plan:   Problem List Items Addressed This Visit      Unprioritized   Granulation tissue at vaginal vault    Resolved.--no need for further f/u.       Other Visit Diagnoses    Need for immunization against influenza    -  Primary    Relevant Orders    Flu Vaccine QUAD 36+ mos IM (Fluarix, Quad PF) (Completed)       Jenaya Saar S 02/04/2015 4:57 PM

## 2015-02-05 ENCOUNTER — Encounter: Payer: BC Managed Care – PPO | Admitting: Physician Assistant

## 2015-02-21 ENCOUNTER — Other Ambulatory Visit: Payer: Self-pay | Admitting: *Deleted

## 2015-02-21 MED ORDER — ZOLPIDEM TARTRATE 10 MG PO TABS: 10.0000 mg | ORAL_TABLET | Freq: Every evening | ORAL | Status: DC | PRN

## 2015-02-21 NOTE — Telephone Encounter (Signed)
Received Rx refill request for Ambien 10mg , please advise. Thanks.

## 2015-02-22 NOTE — Telephone Encounter (Addendum)
Called Ambien to [facility] per Dr Kennon Rounds order. Pt notified.

## 2015-06-04 ENCOUNTER — Ambulatory Visit: Payer: BC Managed Care – PPO | Admitting: Internal Medicine

## 2015-08-26 ENCOUNTER — Other Ambulatory Visit: Payer: Self-pay | Admitting: *Deleted

## 2015-08-26 MED ORDER — ZOLPIDEM TARTRATE 10 MG PO TABS: 10.0000 mg | ORAL_TABLET | Freq: Every evening | ORAL | Status: DC | PRN

## 2015-08-26 NOTE — Addendum Note (Signed)
Addended by: Gretchen Short on: 08/26/2015 01:32 PM   Modules accepted: Orders

## 2015-08-27 ENCOUNTER — Telehealth: Payer: Self-pay | Admitting: *Deleted

## 2015-08-27 ENCOUNTER — Other Ambulatory Visit: Payer: Self-pay | Admitting: *Deleted

## 2015-08-27 NOTE — Telephone Encounter (Signed)
Called in Ambien.

## 2015-09-16 ENCOUNTER — Other Ambulatory Visit: Payer: Self-pay | Admitting: Family Medicine

## 2016-07-14 ENCOUNTER — Ambulatory Visit (INDEPENDENT_AMBULATORY_CARE_PROVIDER_SITE_OTHER): Payer: BC Managed Care – PPO | Admitting: Family Medicine

## 2016-07-14 ENCOUNTER — Encounter: Payer: Self-pay | Admitting: Family Medicine

## 2016-07-14 ENCOUNTER — Encounter (INDEPENDENT_AMBULATORY_CARE_PROVIDER_SITE_OTHER): Payer: Self-pay

## 2016-07-14 VITALS — BP 128/86 | HR 74 | Temp 99.3°F | Ht 60.0 in | Wt 183.8 lb

## 2016-07-14 DIAGNOSIS — Z1231 Encounter for screening mammogram for malignant neoplasm of breast: Secondary | ICD-10-CM

## 2016-07-14 DIAGNOSIS — R41840 Attention and concentration deficit: Secondary | ICD-10-CM

## 2016-07-14 DIAGNOSIS — Z1239 Encounter for other screening for malignant neoplasm of breast: Secondary | ICD-10-CM

## 2016-07-14 NOTE — Progress Notes (Signed)
Pre visit review using our clinic review tool, if applicable. No additional management support is needed unless otherwise documented below in the visit note. 

## 2016-07-14 NOTE — Progress Notes (Signed)
Tommi Rumps, MD Phone: [phone]  Dawn Spencer is a 51 y.o. female who presents today for new patient visit.  Patient feels that she may have ADD. She notes she feels very unorganized and has issues focusing. She's noticed this more since she has gone back to school. She did notice it previously when at home. Has had issues missing assignments. When thinking about it she thinks this may been going on for a long time.  Patient has a strong family history of breast cancer. She is due for colonoscopy or colon cancer screening as well.  Active Ambulatory Problems    Diagnosis Date Noted  . Migraine without aura 06/02/2011  . Insomnia 06/02/2011  . Granulation tissue at vaginal vault 01/10/2015  . Attention deficit 07/14/2016   Resolved Ambulatory Problems    Diagnosis Date Noted  . Bleeding from nipple in female   . Contraception management 06/02/2011  . Migraine, intractable 06/27/2013  . Heavy menstrual bleeding 08/01/2013  . Cervical stenosis (uterine cervix) 10/03/2013  . S/P hysterectomy 04/17/2014  . DUB (dysfunctional uterine bleeding)    Past Medical History:  Diagnosis Date  . Allergic rhinitis   . Bleeding from nipple in female 08/25/07  . Complication of anesthesia 1997  . Diverticulitis   . Generalized headaches   . History of insomnia   . PONV (postoperative nausea and vomiting)     Family History  Problem Relation Age of Onset  . Heart disease Paternal Grandfather   . Cancer Maternal Grandmother     BREAST   . Osteoporosis Mother   . Breast cancer Mother   . Cancer Paternal Aunt     breast caner    Social History   Social History  . Marital status: Married    Spouse name: N/A  . Number of children: N/A  . Years of education: N/A   Occupational History  . Not on file.   Social History Main Topics  . Smoking status: Never Smoker  . Smokeless tobacco: Never Used  . Alcohol use 0.0 oz/week     Comment: occasional  . Drug use: No  .  Sexual activity: Yes    Partners: Male    Birth control/ protection: Surgical   Other Topics Concern  . Not on file   Social History Narrative  . No narrative on file    ROS  General:  Negative for nexplained weight loss, fever Skin: Negative for new or changing mole, sore that won't heal HEENT: Negative for trouble hearing, trouble seeing, ringing in ears, mouth sores, hoarseness, change in voice, dysphagia. CV:  Negative for chest pain, dyspnea, edema, palpitations Resp: Negative for cough, dyspnea, hemoptysis GI: Negative for nausea, vomiting, diarrhea, constipation, abdominal pain, melena, hematochezia. GU: Negative for dysuria, incontinence, urinary hesitance, hematuria, vaginal or penile discharge, polyuria, sexual difficulty, lumps in testicle or breasts MSK: Negative for muscle cramps or aches, joint pain or swelling Neuro: Negative for headaches, weakness, numbness, dizziness, passing out/fainting Psych: Negative for depression, anxiety, memory problems  Objective  Physical Exam Vitals:   07/14/16 1516  BP: 128/86  Pulse: 74  Temp: 99.3 F (37.4 C)    BP Readings from Last 3 Encounters:  07/14/16 128/86  02/04/15 120/80  01/22/15 130/79   Wt Readings from Last 3 Encounters:  07/14/16 183 lb 12.8 oz (83.4 kg)  02/04/15 189 lb (85.7 kg)  01/22/15 193 lb (87.5 kg)    Physical Exam  Constitutional: No distress.  HENT:  Head: Normocephalic and atraumatic.  Mouth/Throat: Oropharynx is clear and moist. No oropharyngeal exudate.  Eyes: Conjunctivae are normal. Pupils are equal, round, and reactive to light.  Neck: Neck supple.  Cardiovascular: Normal rate, regular rhythm and normal heart sounds.   Pulmonary/Chest: Effort normal and breath sounds normal.  Abdominal: Soft. Bowel sounds are normal. She exhibits no distension. There is no tenderness. There is no rebound and no guarding.  Musculoskeletal: She exhibits no edema.  Lymphadenopathy:    She has no  cervical adenopathy.  Neurological: She is alert. Gait normal.  Skin: Skin is warm and dry. She is not diaphoretic.  Psychiatric: Mood and affect normal.     Assessment/Plan:   Attention deficit We will refer to psychology for evaluation of this to determine if the patient meets criteria for medication.   Orders Placed This Encounter  Procedures  . MM Digital Screening    Standing Status:   Future    Standing Expiration Date:   09/13/2017    Order Specific Question:   Reason for Exam (SYMPTOM  OR DIAGNOSIS REQUIRED)    Answer:   breast cancer screening    Order Specific Question:   Is the patient pregnant?    Answer:   No    Order Specific Question:   Preferred imaging location?    Answer:    [facility]  . Ambulatory referral to Psychology    Referral Priority:   Routine    Referral Type:   Psychiatric    Referral Reason:   Specialty Services Required    Requested Specialty:   Psychology    Number of Visits Requested:   1    Meds ordered this encounter  Medications  . cetirizine (ZYRTEC) 10 MG tablet    Sig: Take 10 mg by mouth daily.   Health maintenance: Patient preferred to do cologuard for colon cancer screening. She notes no family history of colon cancer and she has not had any rectal bleeding. Mammogram was also ordered.  Tommi Rumps, MD [place]

## 2016-07-14 NOTE — Assessment & Plan Note (Signed)
We will refer to psychology for evaluation of this to determine if the patient meets criteria for medication.

## 2016-07-14 NOTE — Patient Instructions (Signed)
Nice to meet you. We'll get you set up for mammogram and cologuard. We will get you to see the psychologist as well.

## 2016-09-18 ENCOUNTER — Ambulatory Visit: Payer: BC Managed Care – PPO | Admitting: Psychology

## 2017-02-22 ENCOUNTER — Other Ambulatory Visit: Payer: Self-pay | Admitting: Nurse Practitioner

## 2017-02-22 DIAGNOSIS — Z1231 Encounter for screening mammogram for malignant neoplasm of breast: Secondary | ICD-10-CM

## 2017-03-30 ENCOUNTER — Other Ambulatory Visit: Payer: Self-pay | Admitting: Internal Medicine

## 2017-04-06 ENCOUNTER — Other Ambulatory Visit: Payer: Self-pay | Admitting: Internal Medicine

## 2017-05-06 ENCOUNTER — Ambulatory Visit: Payer: BC Managed Care – PPO | Admitting: Nurse Practitioner

## 2017-05-06 ENCOUNTER — Encounter: Payer: Self-pay | Admitting: Nurse Practitioner

## 2017-05-06 VITALS — BP 120/80 | HR 80 | Resp 16 | Ht 60.0 in | Wt 138.6 lb

## 2017-05-06 DIAGNOSIS — Z0283 Encounter for blood-alcohol and blood-drug test: Secondary | ICD-10-CM

## 2017-05-06 DIAGNOSIS — R41840 Attention and concentration deficit: Secondary | ICD-10-CM | POA: Diagnosis not present

## 2017-05-06 LAB — POCT URINE DRUG SCREEN
METHYLENEDIOXYAMPHETAMINE: NEGATIVE
POC Amphetamine UR: POSITIVE — AB
POC BENZODIAZEPINES UR: NOT DETECTED
POC Barbiturate UR: NOT DETECTED
POC Cocaine UR: NOT DETECTED
POC Ecstasy UR: NOT DETECTED
POC Marijuana UR: NOT DETECTED
POC Methadone UR: NOT DETECTED
POC Methamphetamine UR: NOT DETECTED
POC OPIATE UR: NOT DETECTED
POC OXYCODONE UR: NOT DETECTED
POC PHENCYCLIDINE UR: NOT DETECTED
POC TRICYCLICS UR: NOT DETECTED

## 2017-05-06 MED ORDER — AMPHETAMINE-DEXTROAMPHETAMINE 10 MG PO TABS: 10.0000 mg | ORAL_TABLET | Freq: Two times a day (BID) | ORAL | 0 refills | Status: DC

## 2017-05-06 MED ORDER — AMPHETAMINE-DEXTROAMPHET ER 20 MG PO CP24: 20.0000 mg | ORAL_CAPSULE | Freq: Every day | ORAL | 0 refills | Status: DC

## 2017-05-19 NOTE — Progress Notes (Signed)
[facility] [place], [place] [address]  Internal MEDICINE  Office Visit Note  Patient Name: Dawn Spencer  [id number]  [id number]  Date of Service: 05/19/2017  No chief complaint on file.   Medication Refill  This is a chronic problem. The current episode started more than 1 month ago. The problem has been unchanged. Pertinent negatives include no abdominal pain, arthralgias, chest pain, chills, congestion, coughing, fatigue, joint swelling, nausea, neck pain, numbness, rash, sore throat or vomiting. Associated symptoms comments: Attention and concentration deficit. The symptoms are aggravated by stress. Treatments tried: aphetamine treatment. The treatment provided moderate relief.    Pt is here for routine follow up.    Current Medication: Outpatient Encounter Medications as of 05/06/2017  Medication Sig  . amphetamine-dextroamphetamine (ADDERALL XR) 20 MG 24 hr capsule Take 1 capsule (20 mg total) by mouth daily.  Marland Kitchen amphetamine-dextroamphetamine (ADDERALL) 10 MG tablet Take 1 tablet (10 mg total) by mouth 2 (two) times daily with a meal.  . cetirizine (ZYRTEC) 10 MG tablet Take 10 mg by mouth daily.  . cyclobenzaprine (FLEXERIL) 10 MG tablet TAKE 1/2 TO 1 TABLET BY MOUTH TWICE DAILY AS NEEDED FOR SHOULDER PAIN  . Insulin Syringe-Needle U-100 (ELITE-THIN INS SYR .5CC/31G) 31G X 5/16" 0.5 ML MISC 2 Containers by Does not apply route as needed (Inject 0.45mLs of Imitrex into skin every 2 hours).  . Multiple Vitamins-Minerals (HAIR/SKIN/NAILS PO) Take 2 tablets by mouth daily.  . ondansetron (ZOFRAN ODT) 8 MG disintegrating tablet Take 1 tablet (8 mg total) by mouth every 8 (eight) hours as needed for nausea or vomiting.  . promethazine (PHENERGAN) 25 MG tablet Take 1 tablet (25 mg total) by mouth every 6 (six) hours as needed for nausea or vomiting.  . rizatriptan (MAXALT) 10 MG tablet Take 10 mg by mouth as needed for migraine. 1-2 tab onset migraines  .  SUMAtriptan (IMITREX) 6 MG/0.5ML SOLN injection Inject 0.5 mLs (6 mg total) into the skin every 2 (two) hours as needed for migraine or headache. May repeat in 2 hours if headache persists or recurs.  . [DISCONTINUED] amphetamine-dextroamphetamine (ADDERALL XR) 20 MG 24 hr capsule Take 20 mg by mouth daily.  . [DISCONTINUED] amphetamine-dextroamphetamine (ADDERALL XR) 20 MG 24 hr capsule Take 1 capsule (20 mg total) by mouth daily.  . [DISCONTINUED] amphetamine-dextroamphetamine (ADDERALL XR) 20 MG 24 hr capsule Take 1 capsule (20 mg total) by mouth daily.  . [DISCONTINUED] amphetamine-dextroamphetamine (ADDERALL) 10 MG tablet Take 10 mg by mouth daily with breakfast.  . [DISCONTINUED] amphetamine-dextroamphetamine (ADDERALL) 10 MG tablet Take 1 tablet (10 mg total) by mouth 2 (two) times daily with a meal.  . [DISCONTINUED] amphetamine-dextroamphetamine (ADDERALL) 10 MG tablet Take 1 tablet (10 mg total) by mouth 2 (two) times daily with a meal.  . calcium-vitamin D (OSCAL 500/200 D-3) 500-200 MG-UNIT per tablet Take 1 tablet by mouth 2 (two) times daily.  . SUMAtriptan (IMITREX) 100 MG tablet Take 1 tablet (100 mg total) by mouth once as needed for migraine or headache. May repeat in 2 hours if headache persists or recurs. (Patient not taking: Reported on 05/06/2017)  . valACYclovir (VALTREX) 1000 MG tablet TAKE ONE TABLET BY MOUTH ONCE DAILY (Patient not taking: Reported on 05/06/2017)  . zolpidem (AMBIEN) 10 MG tablet Take 1 tablet (10 mg total) by mouth at bedtime as needed. for sleep (Patient not taking: Reported on 05/06/2017)   No facility-administered encounter medications on file as of 05/06/2017.  Surgical History: Past Surgical History:  Procedure Laterality Date  . CESAREAN SECTION  90,95, 97   X3  . HYSTEROSCOPY N/A 12/12/2013   Procedure: HYSTEROSCOPY WITH DILATION;  Surgeon: Donnamae Jude, MD;  Location: [facility];  Service: Gynecology;  Laterality: N/A;  . TONSILLECTOMY    .  VAGINAL HYSTERECTOMY N/A 04/17/2014   Procedure: HYSTERECTOMY VAGINAL / CYSTOSCOPY;  Surgeon: Donnamae Jude, MD;  Location: [facility];  Service: Gynecology;  Laterality: N/A;    Medical History: Past Medical History:  Diagnosis Date  . Allergic rhinitis   . Bleeding from nipple in female 08/25/07  . Complication of anesthesia 1997   difficult spinal   . Diverticulitis   . Generalized headaches   . History of insomnia   . PONV (postoperative nausea and vomiting)     Family History: Family History  Problem Relation Age of Onset  . Heart disease Paternal Grandfather   . Cancer Maternal Grandmother        BREAST   . Osteoporosis Mother   . Breast cancer Mother   . Cancer Paternal Aunt        breast caner    Social History   Socioeconomic History  . Marital status: Married    Spouse name: Not on file  . Number of children: Not on file  . Years of education: Not on file  . Highest education level: Not on file  Social Needs  . Financial resource strain: Not on file  . Food insecurity - worry: Not on file  . Food insecurity - inability: Not on file  . Transportation needs - medical: Not on file  . Transportation needs - non-medical: Not on file  Occupational History  . Not on file  Tobacco Use  . Smoking status: Never Smoker  . Smokeless tobacco: Never Used  Substance and Sexual Activity  . Alcohol use: Yes    Alcohol/week: 0.0 oz    Comment: occasional  . Drug use: No  . Sexual activity: Yes    Partners: Male    Birth control/protection: Surgical  Other Topics Concern  . Not on file  Social History Narrative  . Not on file      Review of Systems  Constitutional: Negative for chills, fatigue and unexpected weight change.  HENT: Negative for congestion, postnasal drip, rhinorrhea, sneezing and sore throat.   Eyes: Negative.  Negative for redness.  Respiratory: Negative for cough, chest tightness and shortness of breath.   Cardiovascular: Negative for chest pain  and palpitations.  Gastrointestinal: Negative for abdominal pain, constipation, diarrhea, nausea and vomiting.  Endocrine: Negative for cold intolerance, heat intolerance, polydipsia, polyphagia and polyuria.  Genitourinary: Negative for dysuria and frequency.  Musculoskeletal: Negative for arthralgias, back pain, joint swelling and neck pain.  Skin: Negative.  Negative for rash.  Allergic/Immunologic: Negative for environmental allergies.  Neurological: Negative.  Negative for tremors and numbness.  Hematological: Negative for adenopathy. Does not bruise/bleed easily.  Psychiatric/Behavioral: Positive for decreased concentration. Negative for behavioral problems (Depression), sleep disturbance and suicidal ideas. The patient is not nervous/anxious.    Today's Vitals   05/06/17 1539  BP: 120/80  Pulse: 80  Resp: 16  SpO2: 98%  Weight: 138 lb 9.6 oz (62.9 kg)  Height: 5' (1.524 m)    Physical Exam  Constitutional: She is oriented to person, place, and time. She appears well-developed and well-nourished. No distress.  HENT:  Head: Normocephalic and atraumatic.  Mouth/Throat: Oropharynx is clear and moist. No oropharyngeal exudate.  Eyes: EOM are normal. Pupils are equal, round, and reactive to light.  Neck: Normal range of motion. Neck supple. No JVD present. No tracheal deviation present. No thyromegaly present.  Cardiovascular: Normal rate, regular rhythm and normal heart sounds. Exam reveals no gallop and no friction rub.  No murmur heard. Pulmonary/Chest: Effort normal and breath sounds normal. No respiratory distress. She has no wheezes. She has no rales. She exhibits no tenderness.  Abdominal: Soft. Bowel sounds are normal. There is no tenderness.  Musculoskeletal: Normal range of motion.  Lymphadenopathy:    She has no cervical adenopathy.  Neurological: She is alert and oriented to person, place, and time. No cranial nerve deficit.  Skin: Skin is warm and dry. She is not  diaphoretic.  Psychiatric: She has a normal mood and affect. Her behavior is normal. Judgment and thought content normal.  Nursing note and vitals reviewed.  Assessment/Plan:  1. Attention and concentration deficit Doing well with current medication therapy. Will continue adderall XR 20mg  daily with Adderall 10mg  BID as needed. Three 30 day prescriptions were given for both meds. Dates are 05/16/2017, 06/04/2017, and 07/02/2017.  - amphetamine-dextroamphetamine (ADDERALL XR) 20 MG 24 hr capsule; Take 1 capsule (20 mg total) by mouth daily.  Dispense: 30 capsule; Refill: 0 - amphetamine-dextroamphetamine (ADDERALL) 10 MG tablet; Take 1 tablet (10 mg total) by mouth 2 (two) times daily with a meal.  Dispense: 60 tablet; Refill: 0  2. Encounter for drug screening - POCT Urine Drug Screen UDS appropriately positive for AMP only.   General Counseling: gavriella hearst understanding of the findings of todays visit and agrees with plan of treatment. I have discussed any further diagnostic evaluation that may be needed or ordered today. We also reviewed her medications today. she has been encouraged to call the office with any questions or concerns that should arise related to todays visit.      Orders Placed This Encounter  Procedures  . POCT Urine Drug Screen    Meds ordered this encounter  Medications  . DISCONTD: amphetamine-dextroamphetamine (ADDERALL XR) 20 MG 24 hr capsule    Sig: Take 1 capsule (20 mg total) by mouth daily.    Dispense:  30 capsule    Refill:  0    Order Specific Question:   Supervising Provider    Answer:   Lavera Guise [[id number]]  . DISCONTD: amphetamine-dextroamphetamine (ADDERALL XR) 20 MG 24 hr capsule    Sig: Take 1 capsule (20 mg total) by mouth daily.    Dispense:  30 capsule    Refill:  0    Fill after 06/04/2017    Order Specific Question:   Supervising Provider    Answer:   Lavera Guise [[id number]]  . amphetamine-dextroamphetamine (ADDERALL XR) 20 MG 24  hr capsule    Sig: Take 1 capsule (20 mg total) by mouth daily.    Dispense:  30 capsule    Refill:  0    Fill after 07/02/2017    Order Specific Question:   Supervising Provider    Answer:   Lavera Guise [[id number]]  . DISCONTD: amphetamine-dextroamphetamine (ADDERALL) 10 MG tablet    Sig: Take 1 tablet (10 mg total) by mouth 2 (two) times daily with a meal.    Dispense:  60 tablet    Refill:  0    Order Specific Question:   Supervising Provider    Answer:   Lavera Guise [place]  . DISCONTD: amphetamine-dextroamphetamine (ADDERALL) 10 MG tablet  Sig: Take 1 tablet (10 mg total) by mouth 2 (two) times daily with a meal.    Dispense:  60 tablet    Refill:  0    Fill after 06/04/2017    Order Specific Question:   Supervising Provider    Answer:   Lavera Guise [place]  . amphetamine-dextroamphetamine (ADDERALL) 10 MG tablet    Sig: Take 1 tablet (10 mg total) by mouth 2 (two) times daily with a meal.    Dispense:  60 tablet    Refill:  0    Fill after 07/02/2017    Order Specific Question:   Supervising Provider    Answer:   Lavera Guise [[id number]]    Time spent: 72 Minutes     Dr Lavera Guise Internal medicine

## 2017-06-03 ENCOUNTER — Ambulatory Visit: Payer: Self-pay

## 2017-06-03 ENCOUNTER — Other Ambulatory Visit: Payer: Self-pay | Admitting: Occupational Medicine

## 2017-06-03 DIAGNOSIS — M25561 Pain in right knee: Secondary | ICD-10-CM

## 2017-07-22 ENCOUNTER — Other Ambulatory Visit: Payer: Self-pay | Admitting: Orthopedic Surgery

## 2017-07-28 NOTE — Pre-Procedure Instructions (Signed)
ANGELLI BARUCH  07/28/2017      [id number] [address], [place] [address] Phone: [phone] Fax: [phone]  [facility] [place], [place] [address] [address] Phone: [phone] Fax: [phone]  [id number] - [place], [place] - [place] [address] [place] [address] Phone: [phone] Fax: [phone]  [facility] [address], [place] [address] Phone: [phone] Fax: [phone]    Your procedure is scheduled on 08/06/2017.  Report to [facility] Ad[facility] at [place] A[facility].  Call this number if you have problems the morning of surgery:  [phone]   Remember:  Do not eat food or drink liquids after midnight.   Continue all medications as directed by your physician except follow these medication instructions before surgery below   Take these medicines the morning of surgery with A SIP OF WATER: Cetirizine (Zyrtec) Omeprazole (Prilosec)  7 days prior to surgery STOP taking any Aspirin(unless otherwise instructed by your surgeon), Aleve, Naproxen, Ibuprofen, Motrin, Advil, Goody's, BC's, all herbal medications, fish oil, and all vitamins     Do not wear jewelry, make-up or nail polish.  Do not wear lotions, powders, or perfumes, or deodorant.  Do not shave 48 hours prior to surgery.    Do not bring valuables to the hospital.  [facility] is not responsible for any belongings or valuables.  Hearing aids, eyeglasses, contacts, dentures or bridgework may not be worn into surgery.  Leave your suitcase in the car.  After surgery it may be brought to your room.  For patients admitted to the hospital, discharge time will be determined by your treatment team.  Patients discharged the day  of surgery will not be allowed to drive home.   Name and phone number of your driver:    Special instructions:   New Site- Preparing For Surgery  Before surgery, you can play an important role. Because skin is not sterile, your skin needs to be as free of germs as possible. You can reduce the number of germs on your skin by washing with CHG (chlorahexidine gluconate) Soap before surgery.  CHG is an antiseptic cleaner which kills germs and bonds with the skin to continue killing germs even after washing.  Please do not use if you have an allergy to CHG or antibacterial soaps. If your skin becomes reddened/irritated stop using the CHG.  Do not shave (including legs and underarms) for at least 48 hours prior to first CHG shower. It is OK to shave your face.  Please follow these instructions carefully.   1. Shower the NIGHT BEFORE SURGERY and the MORNING OF SURGERY with CHG.   2. If you chose to wash your hair, wash your hair first as usual with your normal shampoo.  3. After you shampoo, rinse your hair and body thoroughly to remove the shampoo.  4. Use CHG as you would any other liquid soap. You can apply CHG directly to the skin and wash gently with a scrungie or a clean washcloth.   5. Apply the CHG Soap to your body ONLY FROM THE NECK DOWN.  Do not use on open wounds or open sores. Avoid contact with your eyes, ears, mouth and genitals (private parts). Wash Face and genitals (private parts)  with your  normal soap.  6. Wash thoroughly, paying special attention to the area where your surgery will be performed.  7. Thoroughly rinse your body with warm water from the neck down.  8. DO NOT shower/wash with your normal soap after using and rinsing off the CHG Soap.  9. Pat yourself dry with a CLEAN TOWEL.  10. Wear CLEAN PAJAMAS to bed the night before surgery, wear comfortable clothes the morning of surgery  11. Place CLEAN SHEETS on your bed the night of your first shower and DO NOT  SLEEP WITH PETS.    Day of Surgery: Shower as stated above. Do not apply any deodorants/lotions.  Please wear clean clothes to the hospital/surgery center.      Please read over the following fact sheets that you were given.

## 2017-07-29 ENCOUNTER — Inpatient Hospital Stay (HOSPITAL_COMMUNITY)
Admission: RE | Admit: 2017-07-29 | Discharge: 2017-07-29 | Disposition: A | Discharge status: Home / Self Care | Payer: Self-pay | Source: Ambulatory Visit | Attending: Orthopedic Surgery | Admitting: Orthopedic Surgery

## 2017-07-29 ENCOUNTER — Encounter (HOSPITAL_COMMUNITY): Payer: Self-pay

## 2017-08-02 NOTE — Pre-Procedure Instructions (Addendum)
LAELYN BLUMENTHAL  08/02/2017      [id number] [address], [place] [address] Phone: [phone] Fax: [phone]  [facility] ([place]) [place], [place] [address] [address] Phone: [phone] Fax: [phone]  [id number] - [place], [place] - [place] AT [place] [address] [place] [address] Phone: [phone] Fax: [phone]  [place] [address], [place] - [place] [address] Phone: [phone] Fax: [phone]    Your procedure is scheduled on Friday, August 06, 2017  Report to [facility] at [place].M.  Call this number if you have problems the morning of surgery:  [phone]   Remember:  Do not eat food or drink liquids after midnight.  Continue all medications as directed by your physician except follow these medication instructions before surgery below   Take these medicines the morning of surgery with A SIP OF WATER  cetirizine (ZYRTEC) omeprazole (PRILOSEC) rizatriptan (MAXALT)- if needed  7 days prior to surgery STOP taking any Aspirin (unless otherwise instructed by your surgeon), Aleve, Naproxen, Ibuprofen, Motrin, Advil, Goody's, BC's, all herbal medications, fish oil, and all vitamins   Do not wear jewelry, make-up or nail polish.  Do not wear lotions, powders, or perfumes, or deodorant.  Do not shave 48 hours prior to surgery.    Do not bring valuables to the hospital.  [facility] is not responsible for any belongings or valuables.  Hearing aids, eyeglasses, contacts, dentures or bridgework may not be worn into surgery.  Leave your suitcase in the car.  After surgery it may be brought to your room.  For patients admitted to the hospital, discharge time will be determined by your  treatment team.  Patients discharged the day of surgery will not be allowed to drive home.    Special instructions:   [place]- Preparing For Surgery  Before surgery, you can play an important role. Because skin is not sterile, your skin needs to be as free of germs as possible. You can reduce the number of germs on your skin by washing with CHG (chlorahexidine gluconate) Soap before surgery.  CHG is an antiseptic cleaner which kills germs and bonds with the skin to continue killing germs even after washing.  Please do not use if you have an allergy to CHG or antibacterial soaps. If your skin becomes reddened/irritated stop using the CHG.  Do not shave (including legs and underarms) for at least 48 hours prior to first CHG shower. It is OK to shave your face.  Please follow these instructions carefully.   1. Shower the NIGHT BEFORE SURGERY and the MORNING OF SURGERY with CHG.   2. If you chose to wash your hair, wash your hair first as usual with your normal shampoo.  3. After you shampoo, rinse your hair and body thoroughly to remove the shampoo.  4. Use CHG as you would any other liquid soap. You can apply CHG directly to the skin and wash gently with a scrungie or a clean washcloth.   5. Apply the CHG Soap to your body ONLY FROM THE NECK DOWN.  Do not use on open wounds or open sores. Avoid contact with your eyes, ears, mouth and genitals (private parts). Wash Face and genitals (private parts)  with your normal soap.  6. Wash thoroughly, paying special attention to the area where your surgery will be performed.  7. Thoroughly rinse your body with warm water from the neck down.  8. DO NOT shower/wash with your normal soap after using and rinsing off the CHG Soap.  9. Pat yourself dry with a CLEAN TOWEL.  10. Wear CLEAN PAJAMAS to bed the night before surgery, wear comfortable clothes the morning of surgery  11. Place CLEAN SHEETS on your bed the night of your first shower and  DO NOT SLEEP WITH PETS.    Day of Surgery: Shower as stated above. Do not apply any deodorants/lotions. Please wear clean clothes to the hospital/surgery center.      Please read over the following fact sheets that you were given.

## 2017-08-02 NOTE — Pre-Procedure Instructions (Signed)
CARIS CERVENY  08/02/2017      [id number] [address], [place] - [place] [address] Phone: [phone] Fax: [phone]  [facility] ([place]) [place], [place] [address] Phone: [phone] Fax: [phone]  [id number] - [place], [place] - [place] AT [place] [address] [place] [address] Phone: [phone] Fax: [phone]  [place] [address], [place] - [place] [address] Phone: [phone] Fax: [phone]    Your procedure is scheduled on April 19  Report to [place] at [place] A.M.  Call this number if you have problems the morning of surgery:  [phone]   Remember:  Do not eat food or drink liquids after midnight.  Continue all medications as directed by your physician except follow these medication instructions before surgery below   Take these medicines the morning of surgery with A SIP OF WATER  cetirizine (ZYRTEC) omeprazole (PRILOSEC) rizatriptan (MAXALT if needed  7 days prior to surgery STOP taking any Aspirin(unless otherwise instructed by your surgeon), Aleve, Naproxen, Ibuprofen, Motrin, Advil, Goody's, BC's, all herbal medications, fish oil, and all vitamins   Do not wear jewelry, make-up or nail polish.  Do not wear lotions, powders, or perfumes, or deodorant.  Do not shave 48 hours prior to surgery.    Do not bring valuables to the hospital.  [facility] is not responsible for any belongings or valuables.  Contacts, dentures or bridgework may not be worn into surgery.  Leave your suitcase in the car.  After surgery it may be brought to your room.  For patients admitted to the hospital, discharge time will be determined by your treatment team.  Patients discharged the day  of surgery will not be allowed to drive home.    Special instructions:   [place]- Preparing For Surgery  Before surgery, you can play an important role. Because skin is not sterile, your skin needs to be as free of germs as possible. You can reduce the number of germs on your skin by washing with CHG (chlorahexidine gluconate) Soap before surgery.  CHG is an antiseptic cleaner which kills germs and bonds with the skin to continue killing germs even after washing.  Please do not use if you have an allergy to CHG or antibacterial soaps. If your skin becomes reddened/irritated stop using the CHG.  Do not shave (including legs and underarms) for at least 48 hours prior to first CHG shower. It is OK to shave your face.  Please follow these instructions carefully.   1. Shower the NIGHT BEFORE SURGERY and the MORNING OF SURGERY with CHG.   2. If you chose to wash your hair, wash your hair first as usual with your normal shampoo.  3. After you shampoo, rinse your hair and body thoroughly to remove the shampoo.  4. Use CHG as you would any other liquid soap. You can apply CHG directly to the skin and wash gently with a scrungie or a clean washcloth.   5. Apply the CHG Soap to your body ONLY FROM THE NECK DOWN.  Do not use on open wounds or open sores. Avoid contact with your eyes, ears, mouth and genitals (private parts). Wash Face and genitals (private parts)  with your normal soap.  6. Wash thoroughly, paying special attention  to the area where your surgery will be performed.  7. Thoroughly rinse your body with warm water from the neck down.  8. DO NOT shower/wash with your normal soap after using and rinsing off the CHG Soap.  9. Pat yourself dry with a CLEAN TOWEL.  10. Wear CLEAN PAJAMAS to bed the night before surgery, wear comfortable clothes the morning of surgery  11. Place CLEAN SHEETS on your bed the night of your first shower and DO NOT SLEEP WITH PETS.    Day of  Surgery: Do not apply any deodorants/lotions. Please wear clean clothes to the hospital/surgery center.      Please read over the following fact sheets that you were given.

## 2017-08-03 ENCOUNTER — Ambulatory Visit: Payer: BC Managed Care – PPO | Admitting: Nurse Practitioner

## 2017-08-03 ENCOUNTER — Other Ambulatory Visit: Payer: Self-pay

## 2017-08-03 ENCOUNTER — Encounter (HOSPITAL_COMMUNITY): Payer: Self-pay

## 2017-08-03 ENCOUNTER — Encounter: Payer: Self-pay | Admitting: Nurse Practitioner

## 2017-08-03 ENCOUNTER — Encounter (HOSPITAL_COMMUNITY)
Admission: RE | Admit: 2017-08-03 | Discharge: 2017-08-03 | Disposition: A | Discharge status: Home / Self Care | Payer: Worker's Compensation | Source: Ambulatory Visit | Attending: Orthopedic Surgery | Admitting: Orthopedic Surgery

## 2017-08-03 DIAGNOSIS — R41840 Attention and concentration deficit: Secondary | ICD-10-CM | POA: Diagnosis not present

## 2017-08-03 DIAGNOSIS — Z01812 Encounter for preprocedural laboratory examination: Secondary | ICD-10-CM | POA: Diagnosis present

## 2017-08-03 DIAGNOSIS — G43911 Migraine, unspecified, intractable, with status migrainosus: Secondary | ICD-10-CM

## 2017-08-03 LAB — CBC
HCT: 41.1 % (ref 36.0–46.0)
Hemoglobin: 13.1 g/dL (ref 12.0–15.0)
MCH: 30.1 pg (ref 26.0–34.0)
MCHC: 31.9 g/dL (ref 30.0–36.0)
MCV: 94.5 fL (ref 78.0–100.0)
PLATELETS: 299 10*3/uL (ref 150–400)
RBC: 4.35 MIL/uL (ref 3.87–5.11)
RDW: 14 % (ref 11.5–15.5)
WBC: 5.9 10*3/uL (ref 4.0–10.5)

## 2017-08-03 MED ORDER — SUMATRIPTAN SUCCINATE 6 MG/0.5ML ~~LOC~~ SOLN: 6.0000 mg | SUBCUTANEOUS | 5 refills | Status: DC | PRN

## 2017-08-03 MED ORDER — AMPHETAMINE-DEXTROAMPHETAMINE 10 MG PO TABS: 10.0000 mg | ORAL_TABLET | Freq: Two times a day (BID) | ORAL | 0 refills | Status: DC

## 2017-08-03 MED ORDER — AMPHETAMINE-DEXTROAMPHET ER 20 MG PO CP24: 20.0000 mg | ORAL_CAPSULE | Freq: Every day | ORAL | 0 refills | Status: DC

## 2017-08-03 MED ORDER — RIZATRIPTAN BENZOATE 10 MG PO TABS: 10.0000 mg | ORAL_TABLET | ORAL | 5 refills | Status: DC | PRN

## 2017-08-03 NOTE — Progress Notes (Signed)
[facility] [place], [place] [address]  Internal MEDICINE  Office Visit Note  Patient Name: Dawn Spencer  [id number]  [id number]  Date of Service: 08/22/2017   Pt is here for routine follow up.   Chief Complaint  Patient presents with  . Follow-up    The patient is currently employed full time and is in school for her bachelor's degree. She is currently taking long acting adderall every day. Will sometimes need to take IR adderall in the afternoons, especially on those days when she has class. She is able to stay focused and on track while taking this medication. She is here to day to get refills for these medications.   Medication Refill  This is a chronic problem. The current episode started more than 1 year ago. The problem occurs intermittently. The problem has been unchanged. Associated symptoms include headaches. Pertinent negatives include no abdominal pain, arthralgias, chest pain, chills, congestion, coughing, fatigue, joint swelling, nausea, neck pain, numbness, rash, sore throat or vomiting. Associated symptoms comments: Attention and concentration deficit. The symptoms are aggravated by stress. Treatments tried: aphetamine treatment. The treatment provided moderate relief.       Current Medication: Outpatient Encounter Medications as of 08/03/2017  Medication Sig  . amphetamine-dextroamphetamine (ADDERALL XR) 20 MG 24 hr capsule Take 1 capsule (20 mg total) by mouth daily. Fill after 08/31/2017  . amphetamine-dextroamphetamine (ADDERALL) 10 MG tablet Take 1 tablet (10 mg total) by mouth 2 (two) times daily with a meal.  . Biotin 10 MG CAPS Take 10 mg by mouth daily.  . Calcium Carb-Cholecalciferol (CALCIUM 600/VITAMIN D3 PO) Take 1 tablet by mouth daily.  . cetirizine (ZYRTEC) 10 MG tablet Take 10 mg by mouth daily.  . Cyanocobalamin (B-12) 5000 MCG CAPS Take by mouth.  . FIBER PO Take 1 capsule by mouth daily. Fiber Well 5 g Probiotic  .  ibuprofen (ADVIL,MOTRIN) 200 MG tablet Take 600-800 mg by mouth every 6 (six) hours as needed for headache or moderate pain.  . Multiple Vitamins-Minerals (MULTIVITAMIN GUMMIES WOMENS PO) Take 2 each by mouth daily.  . naproxen sodium (ALEVE) 220 MG tablet Take 440 mg by mouth daily as needed (for pain or headache).  [place] Kitchen omeprazole (PRILOSEC) 20 MG capsule Take 20 mg by mouth daily.  . promethazine (PHENERGAN) 25 MG tablet Take 1 tablet (25 mg total) by mouth every 6 (six) hours as needed for nausea or vomiting.  . rizatriptan (MAXALT) 10 MG tablet Take 1 tablet (10 mg total) by mouth as needed for migraine.  . [DISCONTINUED] amphetamine-dextroamphetamine (ADDERALL XR) 20 MG 24 hr capsule Take 1 capsule (20 mg total) by mouth daily.  . [DISCONTINUED] amphetamine-dextroamphetamine (ADDERALL XR) 20 MG 24 hr capsule Take 1 capsule (20 mg total) by mouth daily.  . [DISCONTINUED] amphetamine-dextroamphetamine (ADDERALL XR) 20 MG 24 hr capsule Take 1 capsule (20 mg total) by mouth daily. Fill after 08/31/2017  . [DISCONTINUED] amphetamine-dextroamphetamine (ADDERALL) 10 MG tablet Take 1 tablet (10 mg total) by mouth 2 (two) times daily with a meal.  . [DISCONTINUED] amphetamine-dextroamphetamine (ADDERALL) 10 MG tablet Take 1 tablet (10 mg total) by mouth 2 (two) times daily with a meal.  . [DISCONTINUED] amphetamine-dextroamphetamine (ADDERALL) 10 MG tablet Take 1 tablet (10 mg total) by mouth 2 (two) times daily with a meal.  . [DISCONTINUED] rizatriptan (MAXALT) 10 MG tablet Take 10 mg by mouth as needed for migraine.   . [DISCONTINUED] SUMAtriptan (IMITREX) 6 MG/0.5ML SOLN injection Inject 0.5  mLs (6 mg total) into the skin every 2 (two) hours as needed for migraine or headache. May repeat in 2 hours if headache persists or recurs.  . [DISCONTINUED] SUMAtriptan (IMITREX) 6 MG/0.5ML SOLN injection Inject 0.5 mLs (6 mg total) into the skin every 2 (two) hours as needed for migraine or headache.  .  [DISCONTINUED] SUMAtriptan (IMITREX) 6 MG/0.5ML SOLN injection Inject 0.5 mLs (6 mg total) into the skin every 2 (two) hours as needed for migraine or headache.  . Misc Natural Products (COLON CLEANSE PO) Take 1 capsule by mouth daily.  . [DISCONTINUED] Insulin Syringe-Needle U-100 (ELITE-THIN INS SYR .5CC/31G) 31G X 5/16" 0.5 ML MISC 2 Containers by Does not apply route as needed (Inject 0.1mLs of Imitrex into skin every 2 hours). (Patient not taking: Reported on 07/26/2017)  . [DISCONTINUED] ondansetron (ZOFRAN ODT) 8 MG disintegrating tablet Take 1 tablet (8 mg total) by mouth every 8 (eight) hours as needed for nausea or vomiting. (Patient not taking: Reported on 07/26/2017)  . [DISCONTINUED] SUMAtriptan (IMITREX) 100 MG tablet Take 1 tablet (100 mg total) by mouth once as needed for migraine or headache. May repeat in 2 hours if headache persists or recurs. (Patient not taking: Reported on 05/06/2017)  . [DISCONTINUED] valACYclovir (VALTREX) 1000 MG tablet TAKE ONE TABLET BY MOUTH ONCE DAILY (Patient not taking: Reported on 05/06/2017)  . [DISCONTINUED] zolpidem (AMBIEN) 10 MG tablet Take 1 tablet (10 mg total) by mouth at bedtime as needed. for sleep (Patient not taking: Reported on 05/06/2017)   No facility-administered encounter medications on file as of 08/03/2017.     Surgical History: Past Surgical History:  Procedure Laterality Date  . CESAREAN SECTION  90,95, 97   X3  . HYSTEROSCOPY N/A 12/12/2013   Procedure: HYSTEROSCOPY WITH DILATION;  Surgeon: Donnamae Jude, MD;  Location: [facility];  Service: Gynecology;  Laterality: N/A;  . KNEE ARTHROSCOPY Right 08/06/2017   Procedure: ARTHROSCOPY KNEE;  Surgeon: Frederik Pear, MD;  Location: [facility];  Service: Orthopedics;  Laterality: Right;  medial minsectomy, condral malsia, femeral condil  . TONSILLECTOMY    . VAGINAL HYSTERECTOMY N/A 04/17/2014   Procedure: HYSTERECTOMY VAGINAL / CYSTOSCOPY;  Surgeon: Donnamae Jude, MD;  Location: [facility];  Service:  Gynecology;  Laterality: N/A;    Medical History: Past Medical History:  Diagnosis Date  . Allergic rhinitis   . Bleeding from nipple in female 08/25/07  . Complication of anesthesia 1997   difficult spinal - "Couldn't get it to take, but have had one since that was fine"  . Diverticulitis   . Generalized headaches    migraines  . History of insomnia   . PONV (postoperative nausea and vomiting)     Family History: Family History  Problem Relation Age of Onset  . Heart disease Paternal Grandfather   . Cancer Maternal Grandmother        BREAST   . Osteoporosis Mother   . Breast cancer Mother   . Cancer Paternal Aunt        breast caner    Social History   Socioeconomic History  . Marital status: Married    Spouse name: Not on file  . Number of children: Not on file  . Years of education: Not on file  . Highest education level: Not on file  Occupational History  . Not on file  Social Needs  . Financial resource strain: Not on file  . Food insecurity:    Worry: Not on file  Inability: Not on file  . Transportation needs:    Medical: Not on file    Non-medical: Not on file  Tobacco Use  . Smoking status: Never Smoker  . Smokeless tobacco: Never Used  Substance and Sexual Activity  . Alcohol use: Yes    Alcohol/week: 0.0 oz    Comment: occasional  . Drug use: No  . Sexual activity: Yes    Partners: Male    Birth control/protection: Surgical  Lifestyle  . Physical activity:    Days per week: Not on file    Minutes per session: Not on file  . Stress: Not on file  Relationships  . Social connections:    Talks on phone: Not on file    Gets together: Not on file    Attends religious service: Not on file    Active member of club or organization: Not on file    Attends meetings of clubs or organizations: Not on file    Relationship status: Not on file  . Intimate partner violence:    Fear of current or ex partner: Not on file    Emotionally abused: Not on  file    Physically abused: Not on file    Forced sexual activity: Not on file  Other Topics Concern  . Not on file  Social History Narrative  . Not on file      Review of Systems  Constitutional: Negative for activity change, chills, fatigue and unexpected weight change.  HENT: Negative for congestion, postnasal drip, rhinorrhea, sneezing and sore throat.   Eyes: Negative.  Negative for redness.  Respiratory: Negative for cough, chest tightness and shortness of breath.   Cardiovascular: Negative for chest pain and palpitations.  Gastrointestinal: Negative for abdominal pain, constipation, diarrhea, nausea and vomiting.  Endocrine: Negative for cold intolerance, heat intolerance, polydipsia, polyphagia and polyuria.  Genitourinary: Negative.  Negative for dysuria and frequency.  Musculoskeletal: Negative for arthralgias, back pain, joint swelling and neck pain.  Skin: Negative for rash.  Allergic/Immunologic: Negative for environmental allergies.  Neurological: Positive for headaches. Negative for tremors and numbness.  Hematological: Negative for adenopathy. Does not bruise/bleed easily.  Psychiatric/Behavioral: Positive for decreased concentration. Negative for behavioral problems (Depression), sleep disturbance and suicidal ideas. The patient is not nervous/anxious.     Today's Vitals   08/03/17 1549  BP: 134/75  Pulse: 80  Resp: 16  SpO2: 99%  Weight: 123 lb 3.2 oz (55.9 kg)  Height: 5' (1.524 m)    Physical Exam  Constitutional: She is oriented to person, place, and time. She appears well-developed and well-nourished. No distress.  HENT:  Head: Normocephalic and atraumatic.  Mouth/Throat: Oropharynx is clear and moist. No oropharyngeal exudate.  Eyes: Pupils are equal, round, and reactive to light. EOM are normal.  Neck: Normal range of motion. Neck supple. No JVD present. No tracheal deviation present. No thyromegaly present.  Cardiovascular: Normal rate, regular  rhythm and normal heart sounds. Exam reveals no gallop and no friction rub.  No murmur heard. Pulmonary/Chest: Effort normal and breath sounds normal. No respiratory distress. She has no wheezes. She has no rales. She exhibits no tenderness.  Abdominal: Soft. Bowel sounds are normal. There is no tenderness.  Musculoskeletal: Normal range of motion.  Lymphadenopathy:    She has no cervical adenopathy.  Neurological: She is alert and oriented to person, place, and time. No cranial nerve deficit.  Skin: Skin is warm and dry. She is not diaphoretic.  Psychiatric: She has a normal mood  and affect. Her behavior is normal. Judgment and thought content normal.  Nursing note and vitals reviewed.   Assessment/Plan:  1. Intractable migraine with status migrainosus, unspecified migraine type Use aborttive therapy as needed and as prescribed. Refills provided for both today.  - rizatriptan (MAXALT) 10 MG tablet; Take 1 tablet (10 mg total) by mouth as needed for migraine.  Dispense: 10 tablet; Refill: 5  2. Attention and concentration deficit Doing well. Continue adderall XR 20mg  QAM and adderal IR in the  afternoons as needed. Three 30 day prescriptions were provided for both. Dates are 08/03/2017, 08/31/2017, and 09/29/2017.  - amphetamine-dextroamphetamine (ADDERALL XR) 20 MG 24 hr capsule; Take 1 capsule (20 mg total) by mouth daily. Fill after 08/31/2017  Dispense: 30 capsule; Refill: 0 - amphetamine-dextroamphetamine (ADDERALL) 10 MG tablet; Take 1 tablet (10 mg total) by mouth 2 (two) times daily with a meal.  Dispense: 60 tablet; Refill: 0  General Counseling: Dawn Spencer verbalizes understanding of the findings of todays visit and agrees with plan of treatment. I have discussed any further diagnostic evaluation that may be needed or ordered today. We also reviewed her medications today. she has been encouraged to call the office with any questions or concerns that should arise related to todays  visit.  This patient was seen by Leretha Pol, FNP- C in Collaboration with Dr Lavera Guise as a part of collaborative care agreement    Meds ordered this encounter  Medications  . DISCONTD: amphetamine-dextroamphetamine (ADDERALL XR) 20 MG 24 hr capsule    Sig: Take 1 capsule (20 mg total) by mouth daily.    Dispense:  30 capsule    Refill:  0    Order Specific Question:   Supervising Provider    Answer:   Lavera Guise [[id number]]  . DISCONTD: amphetamine-dextroamphetamine (ADDERALL) 10 MG tablet    Sig: Take 1 tablet (10 mg total) by mouth 2 (two) times daily with a meal.    Dispense:  60 tablet    Refill:  0    Order Specific Question:   Supervising Provider    Answer:   Lavera Guise [place]  . DISCONTD: amphetamine-dextroamphetamine (ADDERALL XR) 20 MG 24 hr capsule    Sig: Take 1 capsule (20 mg total) by mouth daily. Fill after 08/31/2017    Dispense:  30 capsule    Refill:  0    Order Specific Question:   Supervising Provider    Answer:   Lavera Guise [[id number]]  . DISCONTD: amphetamine-dextroamphetamine (ADDERALL) 10 MG tablet    Sig: Take 1 tablet (10 mg total) by mouth 2 (two) times daily with a meal.    Dispense:  60 tablet    Refill:  0    Fill after 08/31/2017    Order Specific Question:   Supervising Provider    Answer:   Lavera Guise [place]  . amphetamine-dextroamphetamine (ADDERALL XR) 20 MG 24 hr capsule    Sig: Take 1 capsule (20 mg total) by mouth daily. Fill after 08/31/2017    Dispense:  30 capsule    Refill:  0    Fill after 09/29/2017    Order Specific Question:   Supervising Provider    Answer:   Lavera Guise [[id number]]  . amphetamine-dextroamphetamine (ADDERALL) 10 MG tablet    Sig: Take 1 tablet (10 mg total) by mouth 2 (two) times daily with a meal.    Dispense:  60 tablet    Refill:  0  Fill after 09/29/2017    Order Specific Question:   Supervising Provider    Answer:   Lavera Guise [[id number]]  . DISCONTD: SUMAtriptan (IMITREX) 6 MG/0.5ML SOLN injection     Sig: Inject 0.5 mLs (6 mg total) into the skin every 2 (two) hours as needed for migraine or headache.    Dispense:  1 vial    Refill:  5    Order Specific Question:   Supervising Provider    Answer:   Lavera Guise [[id number]]  . rizatriptan (MAXALT) 10 MG tablet    Sig: Take 1 tablet (10 mg total) by mouth as needed for migraine.    Dispense:  10 tablet    Refill:  5    Order Specific Question:   Supervising Provider    Answer:   Lavera Guise [[id number]]  . DISCONTD: SUMAtriptan (IMITREX) 6 MG/0.5ML SOLN injection    Sig: Inject 0.5 mLs (6 mg total) into the skin every 2 (two) hours as needed for migraine or headache.    Dispense:  1 vial    Refill:  5    Order Specific Question:   Supervising Provider    Answer:   Lavera Guise [[id number]]    Time spent: 68 Minutes      Dr Lavera Guise Internal medicine

## 2017-08-03 NOTE — Progress Notes (Signed)
PCP - Dr. Chancy Milroy Cardiologist - patient denies  Chest x-ray - patient denies EKG - patient denies Stress Test - patient denies ECHO - patient denies Cardiac Cath - patient denies  Sleep Study - patient denies   Anesthesia review: n/a  Patient denies shortness of breath, fever, cough and chest pain at PAT appointment   Patient verbalized understanding of instructions that were given to them at the PAT appointment. Patient was also instructed that they will need to review over the PAT instructions again at home before surgery.

## 2017-08-04 ENCOUNTER — Telehealth: Payer: Self-pay | Admitting: Nurse Practitioner

## 2017-08-04 ENCOUNTER — Other Ambulatory Visit: Payer: Self-pay

## 2017-08-04 DIAGNOSIS — G43911 Migraine, unspecified, intractable, with status migrainosus: Secondary | ICD-10-CM

## 2017-08-04 DIAGNOSIS — S83241A Other tear of medial meniscus, current injury, right knee, initial encounter: Secondary | ICD-10-CM | POA: Diagnosis present

## 2017-08-04 MED ORDER — SUMATRIPTAN SUCCINATE 6 MG/0.5ML ~~LOC~~ SOLN: 6.0000 mg | SUBCUTANEOUS | 1 refills | Status: DC | PRN

## 2017-08-04 NOTE — H&P (Signed)
Dawn Spencer is an 52 y.o. female.   Chief Complaint: Right Knee Pain  HPI:  Patient is here today for follow-up on right knee pain.  She was last seen on 06/22/17 at which time there is concerns for internal derangement of the right knee.  She was referred for a MRI and she is here today to review the results.  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  She is currently using crutches for ambulation and also using a knee immobilizer.  Patient is seen in conjunction with her rehab nurse Maryann Conners, RN.  Past Medical History:  Diagnosis Date  . Allergic rhinitis   . Bleeding from nipple in female 08/25/07  . Complication of anesthesia 1997   difficult spinal - "Couldn't get it to take, but have had one since that was fine"  . Diverticulitis   . Generalized headaches    migraines  . History of insomnia   . PONV (postoperative nausea and vomiting)     Past Surgical History:  Procedure Laterality Date  . CESAREAN SECTION  90,95, 97   X3  . HYSTEROSCOPY N/A 12/12/2013   Procedure: HYSTEROSCOPY WITH DILATION;  Surgeon: Donnamae Jude, MD;  Location: [facility];  Service: Gynecology;  Laterality: N/A;  . TONSILLECTOMY    . VAGINAL HYSTERECTOMY N/A 04/17/2014   Procedure: HYSTERECTOMY VAGINAL / CYSTOSCOPY;  Surgeon: Donnamae Jude, MD;  Location: [facility];  Service: Gynecology;  Laterality: N/A;    Family History  Problem Relation Age of Onset  . Heart disease Paternal Grandfather   . Cancer Maternal Grandmother        BREAST   . Osteoporosis Mother   . Breast cancer Mother   . Cancer Paternal Aunt        breast caner   Social History:  reports that she has never smoked. She has never used smokeless tobacco. She reports that she drinks alcohol. She reports that she does not use drugs.  Allergies: No Known Allergies  No medications prior to admission.    Results for orders placed or performed during the hospital encounter of 08/03/17 (from the past 48 hour(s))   CBC     Status: None   Collection Time: 08/03/17  9:25 AM  Result Value Ref Range   WBC 5.9 4.0 - 10.5 K/uL   RBC 4.35 3.87 - 5.11 MIL/uL   Hemoglobin 13.1 12.0 - 15.0 g/dL   HCT 41.1 36.0 - 46.0 %   MCV 94.5 78.0 - 100.0 fL   MCH 30.1 26.0 - 34.0 pg   MCHC 31.9 30.0 - 36.0 g/dL   RDW 14.0 11.5 - 15.5 %   Platelets 299 150 - 400 K/uL    Comment: Performed at [facility] [address], [place], [place] [address]   No results found.  Review of Systems  Constitutional: Negative.   HENT: Negative.   Eyes: Negative.   Respiratory: Negative.   Cardiovascular: Negative.   Gastrointestinal: Negative.   Genitourinary: Negative.   Musculoskeletal: Positive for myalgias.  Skin: Negative.   Neurological: Negative.   Endo/Heme/Allergies: Negative.   Psychiatric/Behavioral: Negative.     Last menstrual period 12/11/2013. Physical Exam  Constitutional: She is oriented to person, place, and time. She appears well-developed and well-nourished.  HENT:  Head: Normocephalic and atraumatic.  Eyes: Pupils are equal, round, and reactive to light.  Neck: Normal range of motion. Neck supple.  Cardiovascular: Intact distal pulses.  Respiratory: Effort normal.  Musculoskeletal: She exhibits tenderness.  Patient is a pleasant 52 year old female in no acute distress.  She is alert and oriented 3.  Skin is intact.  Respirations are regular and nonlabored.  She continues to have posterior tenderness.  Neurological: She is alert and oriented to person, place, and time.  Skin: Skin is warm and dry.  Psychiatric: She has a normal mood and affect. Her behavior is normal. Judgment and thought content normal.     Assessment/Plan\   Assess: Right knee medial meniscal tear posterior horn after injury at work  Plan: Treatment options are discussed with the patient.  This patient is also discussed with Dr. Mayer Camel.  We discussed the benefits and risk of arthroscopic surgery.  She wishes to  proceed and a posting slip is completed.  We anticipate seeing this patient back at time of surgical intervention.  We are formally asking Worker's Comp. for permission to proceed with a right knee arthroscopy.  Patient is to call with any issues.  No medications were asked for or given today.  Patient is given a light duty work note for seated work with frequent changes in position.  Joanell Rising, PA-C 08/04/2017, 8:03 AM

## 2017-08-04 NOTE — Telephone Encounter (Signed)
Changed Rx sumatriptan 6mg  /.05 ml solution to to 1 pack = to 5 vials

## 2017-08-05 ENCOUNTER — Ambulatory Visit
Admission: RE | Admit: 2017-08-05 | Discharge: 2017-08-05 | Disposition: A | Discharge status: Home / Self Care | Payer: BC Managed Care – PPO | Source: Ambulatory Visit | Attending: Nurse Practitioner | Admitting: Nurse Practitioner

## 2017-08-05 DIAGNOSIS — Z1231 Encounter for screening mammogram for malignant neoplasm of breast: Secondary | ICD-10-CM | POA: Diagnosis not present

## 2017-08-05 NOTE — Anesthesia Preprocedure Evaluation (Signed)
Anesthesia Evaluation  Patient identified by MRN, date of birth, ID band Patient awake    Reviewed: Allergy & Precautions, H&P , Patient's Chart, lab work & pertinent test results, reviewed documented beta blocker date and time   Airway Mallampati: II  TM Distance: >3 FB Neck ROM: full    Dental no notable dental hx.    Pulmonary    Pulmonary exam normal breath sounds clear to auscultation       Cardiovascular  Rhythm:regular Rate:Normal     Neuro/Psych    GI/Hepatic   Endo/Other    Renal/GU      Musculoskeletal   Abdominal   Peds  Hematology   Anesthesia Other Findings   Reproductive/Obstetrics                             Anesthesia Physical Anesthesia Plan  ASA: II  Anesthesia Plan: General   Post-op Pain Management:    Induction: Intravenous  PONV Risk Score and Plan: Treatment may vary due to age or medical condition, Dexamethasone, Ondansetron and Scopolamine patch - Pre-op  Airway Management Planned: LMA  Additional Equipment:   Intra-op Plan:   Post-operative Plan:   Informed Consent: I have reviewed the patients History and Physical, chart, labs and discussed the procedure including the risks, benefits and alternatives for the proposed anesthesia with the patient or authorized representative who has indicated his/her understanding and acceptance.   Dental Advisory Given  Plan Discussed with: CRNA and Surgeon  Anesthesia Plan Comments: ( )        Anesthesia Quick Evaluation

## 2017-08-06 ENCOUNTER — Ambulatory Visit (HOSPITAL_COMMUNITY): Payer: Worker's Compensation | Admitting: Anesthesiology

## 2017-08-06 ENCOUNTER — Encounter (HOSPITAL_COMMUNITY): Payer: Self-pay | Admitting: Anesthesiology

## 2017-08-06 ENCOUNTER — Ambulatory Visit (HOSPITAL_COMMUNITY)
Admission: RE | Admit: 2017-08-06 | Discharge: 2017-08-06 | Disposition: A | Discharge status: Home / Self Care | Payer: Worker's Compensation | Source: Ambulatory Visit | Attending: Orthopedic Surgery | Admitting: Orthopedic Surgery

## 2017-08-06 ENCOUNTER — Encounter (HOSPITAL_COMMUNITY)
Admission: RE | Disposition: A | Discharge status: Home / Self Care | Payer: Self-pay | Source: Ambulatory Visit | Attending: Orthopedic Surgery

## 2017-08-06 DIAGNOSIS — G4700 Insomnia, unspecified: Secondary | ICD-10-CM | POA: Diagnosis not present

## 2017-08-06 DIAGNOSIS — G43909 Migraine, unspecified, not intractable, without status migrainosus: Secondary | ICD-10-CM | POA: Insufficient documentation

## 2017-08-06 DIAGNOSIS — Z8262 Family history of osteoporosis: Secondary | ICD-10-CM | POA: Insufficient documentation

## 2017-08-06 DIAGNOSIS — J309 Allergic rhinitis, unspecified: Secondary | ICD-10-CM | POA: Diagnosis not present

## 2017-08-06 DIAGNOSIS — Z8249 Family history of ischemic heart disease and other diseases of the circulatory system: Secondary | ICD-10-CM | POA: Insufficient documentation

## 2017-08-06 DIAGNOSIS — Z90710 Acquired absence of both cervix and uterus: Secondary | ICD-10-CM | POA: Insufficient documentation

## 2017-08-06 DIAGNOSIS — S83241A Other tear of medial meniscus, current injury, right knee, initial encounter: Secondary | ICD-10-CM

## 2017-08-06 DIAGNOSIS — M23221 Derangement of posterior horn of medial meniscus due to old tear or injury, right knee: Secondary | ICD-10-CM | POA: Diagnosis not present

## 2017-08-06 DIAGNOSIS — M94261 Chondromalacia, right knee: Secondary | ICD-10-CM | POA: Insufficient documentation

## 2017-08-06 DIAGNOSIS — Z803 Family history of malignant neoplasm of breast: Secondary | ICD-10-CM | POA: Insufficient documentation

## 2017-08-06 HISTORY — PX: KNEE ARTHROSCOPY: SHX127

## 2017-08-06 SURGERY — ARTHROSCOPY, KNEE
Anesthesia: General | Laterality: Right

## 2017-08-06 MED ORDER — CHLORHEXIDINE GLUCONATE 4 % EX LIQD: 60.0000 mL | Freq: Once | CUTANEOUS | Status: DC

## 2017-08-06 MED ORDER — FENTANYL CITRATE (PF) 100 MCG/2ML IJ SOLN
INTRAMUSCULAR | Status: DC | PRN
  Administered 2017-08-06 (×5): 50 ug via INTRAVENOUS

## 2017-08-06 MED ORDER — FENTANYL CITRATE (PF) 100 MCG/2ML IJ SOLN
25.0000 ug | INTRAMUSCULAR | Status: DC | PRN
  Administered 2017-08-06 (×2): 50 ug via INTRAVENOUS

## 2017-08-06 MED ORDER — EPINEPHRINE PF 1 MG/ML IJ SOLN
INTRAMUSCULAR | Status: DC | PRN
  Administered 2017-08-06: 1 mg

## 2017-08-06 MED ORDER — ONDANSETRON HCL 4 MG/2ML IJ SOLN
INTRAMUSCULAR | Status: AC
  Filled 2017-08-06: qty 2

## 2017-08-06 MED ORDER — STERILE WATER FOR IRRIGATION IR SOLN
Status: DC | PRN
  Administered 2017-08-06: 1000 mL

## 2017-08-06 MED ORDER — SCOPOLAMINE 1 MG/3DAYS TD PT72
MEDICATED_PATCH | TRANSDERMAL | Status: DC | PRN
  Administered 2017-08-06: 1 via TRANSDERMAL

## 2017-08-06 MED ORDER — MIDAZOLAM HCL 2 MG/2ML IJ SOLN
INTRAMUSCULAR | Status: AC
  Filled 2017-08-06: qty 2

## 2017-08-06 MED ORDER — LIDOCAINE HCL (CARDIAC) PF 100 MG/5ML IV SOSY
PREFILLED_SYRINGE | INTRAVENOUS | Status: DC | PRN
  Administered 2017-08-06: 100 mg via INTRATRACHEAL

## 2017-08-06 MED ORDER — PROPOFOL 10 MG/ML IV BOLUS
INTRAVENOUS | Status: DC | PRN
  Administered 2017-08-06: 120 mg via INTRAVENOUS

## 2017-08-06 MED ORDER — FENTANYL CITRATE (PF) 250 MCG/5ML IJ SOLN
INTRAMUSCULAR | Status: AC
  Filled 2017-08-06: qty 5

## 2017-08-06 MED ORDER — FENTANYL CITRATE (PF) 100 MCG/2ML IJ SOLN
INTRAMUSCULAR | Status: AC
  Administered 2017-08-06: 50 ug via INTRAVENOUS
  Filled 2017-08-06: qty 2

## 2017-08-06 MED ORDER — MIDAZOLAM HCL 5 MG/5ML IJ SOLN
INTRAMUSCULAR | Status: DC | PRN
  Administered 2017-08-06: 2 mg via INTRAVENOUS

## 2017-08-06 MED ORDER — LIDOCAINE 2% (20 MG/ML) 5 ML SYRINGE
INTRAMUSCULAR | Status: AC
  Filled 2017-08-06: qty 5

## 2017-08-06 MED ORDER — ONDANSETRON HCL 4 MG/2ML IJ SOLN
INTRAMUSCULAR | Status: DC | PRN
  Administered 2017-08-06: 4 mg via INTRAVENOUS

## 2017-08-06 MED ORDER — LACTATED RINGERS IV SOLN
INTRAVENOUS | Status: DC
  Administered 2017-08-06: 10 mL/h via INTRAVENOUS

## 2017-08-06 MED ORDER — HYDROCODONE-ACETAMINOPHEN 5-325 MG PO TABS
ORAL_TABLET | ORAL | Status: AC
  Filled 2017-08-06: qty 1

## 2017-08-06 MED ORDER — BUPIVACAINE-EPINEPHRINE 0.5% -1:200000 IJ SOLN
INTRAMUSCULAR | Status: DC | PRN
  Administered 2017-08-06: 30 mL

## 2017-08-06 MED ORDER — EPINEPHRINE PF 1 MG/ML IJ SOLN
INTRAMUSCULAR | Status: AC
  Filled 2017-08-06: qty 1

## 2017-08-06 MED ORDER — CEFAZOLIN SODIUM-DEXTROSE 2-4 GM/100ML-% IV SOLN
INTRAVENOUS | Status: AC
  Filled 2017-08-06: qty 100

## 2017-08-06 MED ORDER — PROPOFOL 10 MG/ML IV BOLUS
INTRAVENOUS | Status: AC
  Filled 2017-08-06: qty 40

## 2017-08-06 MED ORDER — 0.9 % SODIUM CHLORIDE (POUR BTL) OPTIME
TOPICAL | Status: DC | PRN
  Administered 2017-08-06: 1000 mL

## 2017-08-06 MED ORDER — CEFAZOLIN SODIUM-DEXTROSE 2-4 GM/100ML-% IV SOLN
2.0000 g | INTRAVENOUS | Status: AC
  Administered 2017-08-06: 2 g via INTRAVENOUS

## 2017-08-06 MED ORDER — BUPIVACAINE-EPINEPHRINE (PF) 0.5% -1:200000 IJ SOLN
INTRAMUSCULAR | Status: AC
  Filled 2017-08-06: qty 30

## 2017-08-06 MED ORDER — SCOPOLAMINE 1 MG/3DAYS TD PT72
MEDICATED_PATCH | TRANSDERMAL | Status: AC
  Filled 2017-08-06: qty 1

## 2017-08-06 MED ORDER — DEXAMETHASONE SODIUM PHOSPHATE 10 MG/ML IJ SOLN
INTRAMUSCULAR | Status: DC | PRN
  Administered 2017-08-06: 10 mg via INTRAVENOUS

## 2017-08-06 MED ORDER — HYDROCODONE-ACETAMINOPHEN 5-325 MG PO TABS: 1.0000 | ORAL_TABLET | Freq: Four times a day (QID) | ORAL | 0 refills | Status: DC | PRN

## 2017-08-06 MED ORDER — SODIUM CHLORIDE 0.9 % IR SOLN
Status: DC | PRN
  Administered 2017-08-06: 3000 mL

## 2017-08-06 MED ORDER — DEXAMETHASONE SODIUM PHOSPHATE 10 MG/ML IJ SOLN
INTRAMUSCULAR | Status: AC
  Filled 2017-08-06: qty 1

## 2017-08-06 MED ORDER — HYDROCODONE-ACETAMINOPHEN 5-325 MG PO TABS
1.0000 | ORAL_TABLET | Freq: Once | ORAL | Status: AC
  Administered 2017-08-06: 1 via ORAL

## 2017-08-06 SURGICAL SUPPLY — 36 items
BANDAGE ACE 6X5 VEL STRL LF (GAUZE/BANDAGES/DRESSINGS) ×2 IMPLANT
BLADE CUDA 5.5 (BLADE) IMPLANT
BLADE CUTTER GATOR 3.5 (BLADE) ×2 IMPLANT
BLADE GREAT WHITE 4.2 (BLADE) ×2 IMPLANT
BUR OVAL 6.0 (BURR) IMPLANT
DRAPE ARTHROSCOPY W/POUCH 114 (DRAPES) ×2 IMPLANT
DURAPREP 26ML APPLICATOR (WOUND CARE) ×2 IMPLANT
GAUZE SPONGE 4X4 12PLY STRL (GAUZE/BANDAGES/DRESSINGS) ×2 IMPLANT
GAUZE SPONGE 4X4 12PLY STRL LF (GAUZE/BANDAGES/DRESSINGS) ×2 IMPLANT
GAUZE XEROFORM 1X8 LF (GAUZE/BANDAGES/DRESSINGS) ×2 IMPLANT
GLOVE BIO SURGEON STRL SZ7.5 (GLOVE) ×2 IMPLANT
GLOVE BIO SURGEON STRL SZ8.5 (GLOVE) ×4 IMPLANT
GLOVE BIOGEL PI IND STRL 8 (GLOVE) ×2 IMPLANT
GLOVE BIOGEL PI IND STRL 9 (GLOVE) ×1 IMPLANT
GLOVE BIOGEL PI INDICATOR 8 (GLOVE) ×2
GLOVE BIOGEL PI INDICATOR 9 (GLOVE) ×1
GOWN STRL REUS W/ TWL LRG LVL3 (GOWN DISPOSABLE) ×3 IMPLANT
GOWN STRL REUS W/ TWL XL LVL3 (GOWN DISPOSABLE) ×2 IMPLANT
GOWN STRL REUS W/TWL LRG LVL3 (GOWN DISPOSABLE) ×3
GOWN STRL REUS W/TWL XL LVL3 (GOWN DISPOSABLE) ×2
KIT BASIN OR (CUSTOM PROCEDURE TRAY) ×2 IMPLANT
KIT TURNOVER KIT B (KITS) ×2 IMPLANT
MANIFOLD NEPTUNE II (INSTRUMENTS) ×2 IMPLANT
NEEDLE 18GX1X1/2 (RX/OR ONLY) (NEEDLE) ×2 IMPLANT
NEEDLE FILTER BLUNT 18X 1/2SAF (NEEDLE) ×1
NEEDLE FILTER BLUNT 18X1 1/2 (NEEDLE) ×1 IMPLANT
PACK ARTHROSCOPY DSU (CUSTOM PROCEDURE TRAY) ×2 IMPLANT
PAD ABD 8X10 STRL (GAUZE/BANDAGES/DRESSINGS) ×2 IMPLANT
PAD ARMBOARD 7.5X6 YLW CONV (MISCELLANEOUS) ×2 IMPLANT
PROBE BIPOLAR ATHRO 135MM 90D (MISCELLANEOUS) ×2 IMPLANT
SET ARTHROSCOPY TUBING (MISCELLANEOUS) ×1
SET ARTHROSCOPY TUBING LN (MISCELLANEOUS) ×1 IMPLANT
SPONGE LAP 4X18 X RAY DECT (DISPOSABLE) IMPLANT
SYR 10ML LL (SYRINGE) ×2 IMPLANT
TOWEL OR 17X24 6PK STRL BLUE (TOWEL DISPOSABLE) ×4 IMPLANT
WATER STERILE IRR 1000ML POUR (IV SOLUTION) ×2 IMPLANT

## 2017-08-06 NOTE — Anesthesia Procedure Notes (Signed)
Procedure Name: LMA Insertion Date/Time: 08/06/2017 8:18 AM Performed by: Scheryl Darter, CRNA Pre-anesthesia Checklist: Patient identified, Emergency Drugs available, Suction available and Patient being monitored Patient Re-evaluated:Patient Re-evaluated prior to induction Oxygen Delivery Method: Circle System Utilized Preoxygenation: Pre-oxygenation with 100% oxygen Induction Type: IV induction Ventilation: Mask ventilation without difficulty LMA: LMA inserted LMA Size: 4.0 Number of attempts: 1 Airway Equipment and Method: Bite block Placement Confirmation: positive ETCO2 Tube secured with: Tape Dental Injury: Teeth and Oropharynx as per pre-operative assessment

## 2017-08-06 NOTE — Transfer of Care (Signed)
Immediate Anesthesia Transfer of Care Note  Patient: Dawn Spencer  Procedure(s) Performed: ARTHROSCOPY KNEE (Right )  Patient Location: PACU  Anesthesia Type:General  Level of Consciousness: awake, alert , oriented and sedated  Airway & Oxygen Therapy: Patient Spontanous Breathing and Patient connected to nasal cannula oxygen  Post-op Assessment: Report given to RN, Post -op Vital signs reviewed and stable and Patient moving all extremities  Post vital signs: Reviewed and stable  Last Vitals:  Vitals Value Taken Time  BP 131/87 08/06/2017  9:10 AM  Temp 36.4 C 08/06/2017  9:10 AM  Pulse 78 08/06/2017  9:10 AM  Resp 17 08/06/2017  9:10 AM  SpO2 99 % 08/06/2017  9:10 AM    Last Pain:  Vitals:   08/06/17 0910  TempSrc:   PainSc: (P) Asleep      Patients Stated Pain Goal: 3 (15/86/82 [id number])  Complications: No apparent anesthesia complications

## 2017-08-06 NOTE — Interval H&P Note (Signed)
History and Physical Interval Note:  08/06/2017 7:52 AM  Dawn Spencer  has presented today for surgery, with the diagnosis of right knee medial meniscus tear  The various methods of treatment have been discussed with the patient and family. After consideration of risks, benefits and other options for treatment, the patient has consented to  Procedure(s): ARTHROSCOPY KNEE (Right) as a surgical intervention .  The patient's history has been reviewed, patient examined, no change in status, stable for surgery.  I have reviewed the patient's chart and labs.  Questions were answered to the patient's satisfaction.     Kerin Salen

## 2017-08-06 NOTE — Op Note (Signed)
Pre-Op Dx: Symptomatic right knee posterior horn radial medial meniscal tear, work-related.  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 Mayer Camel M.D.  Assist: Kerry Hough. Barton Dubois  (present throughout entire procedure and necessary for timely completion of the procedure) Anes: General LMA  EBL: Minimal  Fluids: 800 cc   Indications: Patient was injured at work a few months ago with persistent pain catching and popping in her right knee.. Pt has failed conservative treatment with anti-inflammatory medicines, physical therapy, and modified activites but did get good temporarily from an intra-articular cortisone injection.  MRI scan was accomplished showing large posterior horn medial meniscal tear radial about 5 mm off of the base of the root.  It was complex in multiplane.  Pain has recurred and patient desires elective arthroscopic evaluation and treatment of knee. Risks and benefits of surgery have been discussed and questions answered.  Procedure: Patient identified by arm band and taken to the operating room at the day surgery Center. The appropriate anesthetic monitors were attached, and General LMA anesthesia was induced without difficulty. Lateral post was applied to the table and the lower extremity was prepped and draped in usual sterile fashion from the ankle to the midthigh. Time out procedure was performed. We began the operation by making standard inferior lateral and inferior medial peripatellar portals with a #11 blade allowing introduction of the arthroscope through the inferior lateral portal and the out flow to the inferior medial portal. Pump pressure was set at 100 mmHg and diagnostic arthroscopy  revealed the patellofemoral joint was pristine.  Moving into the medial compartment the anterior medial horns of  the medial meniscus were normal the posterior horn did have a large primarily radial tear with a horizontal cleavage component that was debrided back to stable margin using a large upbiter small straight biter 4.2 gray-white sucker shaver and 3.5 mm Gator resector shaver.  The distal aspect of the medial femoral condyle near the notch also a grade II-III chondromalacia was debrided back to a stable margin with a 3 5 Gator resector shaver.  ACL and the PCL are intact the lateral compartment was in excellent condition.. The knee was irrigated out normal saline solution. A dressing of xerofoam 4 x 4 dressing sponges, web roll and an Ace wrap was applied. The patient was awakened extubated and taken to the recovery without difficulty.    Signed: Kerin Salen, MD

## 2017-08-06 NOTE — Discharge Instructions (Addendum)
Knee Arthroscopy, Care After °Refer to this sheet in the next few weeks. These instructions provide you with information about caring for yourself after your procedure. Your health care provider may also give you more specific instructions. Your treatment has been planned according to current medical practices, but problems sometimes occur. Call your health care provider if you have any problems or questions after your procedure. °What can I expect after the procedure? °After the procedure, it is common to have: °· Soreness. °· Pain. ° °Follow these instructions at home: °Bathing °· Do not take baths, swim, or use a hot tub until your health care provider approves. °Incision care °· There are many different ways to close and cover an incision, including stitches, skin glue, and adhesive strips. Follow your health care provider’s instructions about: °? Incision care. °? Bandage (dressing) changes and removal. °? Incision closure removal. °· Check your incision area every day for signs of infection. Watch for: °? Redness, swelling, or pain. °? Fluid, blood, or pus. °Activity °· Avoid strenuous activities for as long as directed by your health care provider. °· Return to your normal activities as directed by your health care provider. Ask your health care provider what activities are safe for you. °· Perform range-of-motion exercises only as directed by your health care provider. °· Do not lift anything that is heavier than 10 lb (4.5 kg). °· Do not drive or operate heavy machinery while taking pain medicine. °· If you were given crutches, use them as directed by your health care provider. °Managing pain, stiffness, and swelling °· If directed, apply ice to the injured area: °? Put ice in a plastic bag. °? Place a towel between your skin and the bag. °? Leave the ice on for 20 minutes, 2-3 times per day. °· Raise the injured area above the level of your heart while you are sitting or lying down as directed by your  health care provider. °General instructions °· Keep all follow-up visits as directed by your health care provider. This is important. °· Take medicines only as directed by your health care provider. °· Do not use any tobacco products, including cigarettes, chewing tobacco, or electronic cigarettes. If you need help quitting, ask your health care provider. °· If you were given compression stockings, wear them as directed by your health care provider. These stockings help prevent blood clots and reduce swelling in your legs. °Contact a health care provider if: °· You have severe pain with any movement of your knee. °· You notice a bad smell coming from the incision or dressing. °· You have redness, swelling, or pain at the site of your incision. °· You have fluid, blood, or pus coming from your incision. °Get help right away if: °· You develop a rash. °· You have a fever. °· You have difficulty breathing or have shortness of breath. °· You develop pain in your calves or in the back of your knee. °· You develop chest pain. °· You develop numbness or tingling in your leg or foot. °This information is not intended to replace advice given to you by your health care provider. Make sure you discuss any questions you have with your health care provider. °Document Released: 10/24/2004 Document Revised: 09/06/2015 Document Reviewed: 04/02/2014 °Elsevier Interactive Patient Education © 2018 [employer] ° °

## 2017-08-06 NOTE — Anesthesia Postprocedure Evaluation (Signed)
Anesthesia Post Note  Patient: Dawn Spencer  Procedure(s) Performed: ARTHROSCOPY KNEE (Right )     Patient location during evaluation: PACU Anesthesia Type: General Level of consciousness: awake and alert Pain management: pain level controlled Vital Signs Assessment: post-procedure vital signs reviewed and stable Respiratory status: spontaneous breathing, nonlabored ventilation, respiratory function stable and patient connected to nasal cannula oxygen Cardiovascular status: blood pressure returned to baseline and stable Postop Assessment: no apparent nausea or vomiting Anesthetic complications: no    Last Vitals:  Vitals:   08/06/17 0910 08/06/17 0925  BP: 131/87 136/80  Pulse: 78 75  Resp: 17 14  Temp: (!) 36.4 C   SpO2: 99% 100%    Last Pain:  Vitals:   08/06/17 1000  TempSrc:   PainSc: Asleep                 Bhakti Labella EDWARD

## 2017-08-07 ENCOUNTER — Encounter (HOSPITAL_COMMUNITY): Payer: Self-pay | Admitting: Orthopedic Surgery

## 2017-10-26 ENCOUNTER — Ambulatory Visit (INDEPENDENT_AMBULATORY_CARE_PROVIDER_SITE_OTHER): Payer: BC Managed Care – PPO | Admitting: Nurse Practitioner

## 2017-10-26 ENCOUNTER — Encounter: Payer: Self-pay | Admitting: Nurse Practitioner

## 2017-10-26 VITALS — BP 137/70 | HR 89 | Resp 16 | Ht 60.0 in | Wt 127.0 lb

## 2017-10-26 DIAGNOSIS — R41840 Attention and concentration deficit: Secondary | ICD-10-CM

## 2017-10-26 DIAGNOSIS — G43911 Migraine, unspecified, intractable, with status migrainosus: Secondary | ICD-10-CM

## 2017-10-26 MED ORDER — AMPHETAMINE-DEXTROAMPHETAMINE 20 MG PO TABS: 10.0000 mg | ORAL_TABLET | Freq: Two times a day (BID) | ORAL | 0 refills | Status: DC

## 2017-10-26 MED ORDER — AMPHETAMINE-DEXTROAMPHET ER 20 MG PO CP24: 20.0000 mg | ORAL_CAPSULE | Freq: Every day | ORAL | 0 refills | Status: DC

## 2017-10-26 NOTE — Progress Notes (Signed)
[facility] [place], [place] [address]  Internal MEDICINE  Office Visit Note  Patient Name: Dawn Spencer  [id number]  [id number]  Date of Service: 11/24/2017   Pt is here for routine follow up.    Chief Complaint  Patient presents with  . ADD    medication refills.     The patient is currently employed full time and is in school for her bachelor's degree. She is currently taking long acting adderall every day. Will sometimes need to take IR adderall in the afternoons, especially on those days when she has class. She is able to stay focused and on track while taking this medication. She is here to day to get refills for these medications.   Medication Refill  This is a chronic problem. The current episode started more than 1 year ago. The problem occurs intermittently. The problem has been unchanged. Associated symptoms include headaches. Pertinent negatives include no abdominal pain, arthralgias, chest pain, chills, congestion, coughing, fatigue, joint swelling, nausea, neck pain, numbness, rash, sore throat or vomiting. Associated symptoms comments: Attention and concentration deficit. The symptoms are aggravated by stress. Treatments tried: aphetamine treatment. The treatment provided moderate relief.       Current Medication: Outpatient Encounter Medications as of 10/26/2017  Medication Sig  . amphetamine-dextroamphetamine (ADDERALL XR) 20 MG 24 hr capsule Take 1 capsule (20 mg total) by mouth daily. Fill after 08/31/2017  . amphetamine-dextroamphetamine (ADDERALL) 20 MG tablet Take 0.5 tablets (10 mg total) by mouth 2 (two) times daily with a meal.  . Biotin 10 MG CAPS Take 10 mg by mouth daily.  . Calcium Carb-Cholecalciferol (CALCIUM 600/VITAMIN D3 PO) Take 1 tablet by mouth daily.  . cetirizine (ZYRTEC) 10 MG tablet Take 10 mg by mouth daily.  . Cyanocobalamin (B-12) 5000 MCG CAPS Take by mouth.  . FIBER PO Take 1 capsule by mouth daily. Fiber Well  5 g Probiotic  . HYDROcodone-acetaminophen (NORCO) 5-325 MG tablet Take 1 tablet by mouth every 6 (six) hours as needed.  Marland Kitchen ibuprofen (ADVIL,MOTRIN) 200 MG tablet Take 600-800 mg by mouth every 6 (six) hours as needed for headache or moderate pain.  . Misc Natural Products (COLON CLEANSE PO) Take 1 capsule by mouth daily.  . Multiple Vitamins-Minerals (MULTIVITAMIN GUMMIES WOMENS PO) Take 2 each by mouth daily.  . naproxen sodium (ALEVE) 220 MG tablet Take 440 mg by mouth daily as needed (for pain or headache).  Marland Kitchen omeprazole (PRILOSEC) 20 MG capsule Take 20 mg by mouth daily.  . promethazine (PHENERGAN) 25 MG tablet Take 1 tablet (25 mg total) by mouth every 6 (six) hours as needed for nausea or vomiting.  . rizatriptan (MAXALT) 10 MG tablet Take 1 tablet (10 mg total) by mouth as needed for migraine.  . SUMAtriptan (IMITREX) 6 MG/0.5ML SOLN injection Inject 0.5 mLs (6 mg total) into the skin every 2 (two) hours as needed for migraine or headache.  . [DISCONTINUED] amphetamine-dextroamphetamine (ADDERALL XR) 20 MG 24 hr capsule Take 1 capsule (20 mg total) by mouth daily. Fill after 08/31/2017  . [DISCONTINUED] amphetamine-dextroamphetamine (ADDERALL XR) 20 MG 24 hr capsule Take 1 capsule (20 mg total) by mouth daily. Fill after 08/31/2017  . [DISCONTINUED] amphetamine-dextroamphetamine (ADDERALL XR) 20 MG 24 hr capsule Take 1 capsule (20 mg total) by mouth daily. Fill after 08/31/2017  . [DISCONTINUED] amphetamine-dextroamphetamine (ADDERALL) 10 MG tablet Take 1 tablet (10 mg total) by mouth 2 (two) times daily with a meal.  . [DISCONTINUED]  amphetamine-dextroamphetamine (ADDERALL) 20 MG tablet Take 0.5 tablets (10 mg total) by mouth 2 (two) times daily with a meal.  . [DISCONTINUED] amphetamine-dextroamphetamine (ADDERALL) 20 MG tablet Take 0.5 tablets (10 mg total) by mouth 2 (two) times daily with a meal.   No facility-administered encounter medications on file as of 10/26/2017.     Surgical  History: Past Surgical History:  Procedure Laterality Date  . CESAREAN SECTION  90,95, 97   X3  . HYSTEROSCOPY N/A 12/12/2013   Procedure: HYSTEROSCOPY WITH DILATION;  Surgeon: Donnamae Jude, MD;  Location: [facility];  Service: Gynecology;  Laterality: N/A;  . KNEE ARTHROSCOPY Right 08/06/2017   Procedure: ARTHROSCOPY KNEE;  Surgeon: Frederik Pear, MD;  Location: [place];  Service: Orthopedics;  Laterality: Right;  medial minsectomy, condral malsia, femeral condil  . TONSILLECTOMY    . VAGINAL HYSTERECTOMY N/A 04/17/2014   Procedure: HYSTERECTOMY VAGINAL / CYSTOSCOPY;  Surgeon: Donnamae Jude, MD;  Location: [facility];  Service: Gynecology;  Laterality: N/A;    Medical History: Past Medical History:  Diagnosis Date  . Allergic rhinitis   . Bleeding from nipple in female 08/25/07  . Complication of anesthesia 1997   difficult spinal - "Couldn't get it to take, but have had one since that was fine"  . Diverticulitis   . Generalized headaches    migraines  . History of insomnia   . PONV (postoperative nausea and vomiting)     Family History: Family History  Problem Relation Age of Onset  . Heart disease Paternal Grandfather   . Cancer Maternal Grandmother        BREAST   . Osteoporosis Mother   . Breast cancer Mother   . Cancer Paternal Aunt        breast caner    Social History   Socioeconomic History  . Marital status: Married    Spouse name: Not on file  . Number of children: Not on file  . Years of education: Not on file  . Highest education level: Not on file  Occupational History  . Not on file  Social Needs  . Financial resource strain: Not on file  . Food insecurity:    Worry: Not on file    Inability: Not on file  . Transportation needs:    Medical: Not on file    Non-medical: Not on file  Tobacco Use  . Smoking status: Never Smoker  . Smokeless tobacco: Never Used  Substance and Sexual Activity  . Alcohol use: Yes    Alcohol/week: 0.0 oz    Comment: occasional   . Drug use: No  . Sexual activity: Yes    Partners: Male    Birth control/protection: Surgical  Lifestyle  . Physical activity:    Days per week: Not on file    Minutes per session: Not on file  . Stress: Not on file  Relationships  . Social connections:    Talks on phone: Not on file    Gets together: Not on file    Attends religious service: Not on file    Active member of club or organization: Not on file    Attends meetings of clubs or organizations: Not on file    Relationship status: Not on file  . Intimate partner violence:    Fear of current or ex partner: Not on file    Emotionally abused: Not on file    Physically abused: Not on file    Forced sexual activity: Not on file  Other Topics Concern  . Not on file  Social History Narrative  . Not on file      Review of Systems  Constitutional: Negative for activity change, chills, fatigue and unexpected weight change.  HENT: Negative for congestion, postnasal drip, rhinorrhea, sneezing and sore throat.   Eyes: Negative.  Negative for redness.  Respiratory: Negative for cough, chest tightness and shortness of breath.   Cardiovascular: Negative for chest pain and palpitations.  Gastrointestinal: Negative for abdominal pain, constipation, diarrhea, nausea and vomiting.  Endocrine: Negative for cold intolerance, heat intolerance, polydipsia, polyphagia and polyuria.  Genitourinary: Negative.  Negative for dysuria and frequency.  Musculoskeletal: Negative for arthralgias, back pain, joint swelling and neck pain.  Skin: Negative for rash.  Allergic/Immunologic: Negative for environmental allergies.  Neurological: Positive for headaches. Negative for tremors and numbness.  Hematological: Negative for adenopathy. Does not bruise/bleed easily.  Psychiatric/Behavioral: Positive for decreased concentration. Negative for behavioral problems (Depression), sleep disturbance and suicidal ideas. The patient is not nervous/anxious.      Today's Vitals   10/26/17 1515  BP: 137/70  Pulse: 89  Resp: 16  SpO2: 100%  Weight: 127 lb (57.6 kg)  Height: 5' (1.524 m)    Physical Exam  Constitutional: She is oriented to person, place, and time. She appears well-developed and well-nourished. No distress.  HENT:  Head: Normocephalic and atraumatic.  Nose: Nose normal.  Mouth/Throat: Oropharynx is clear and moist. No oropharyngeal exudate.  Eyes: Pupils are equal, round, and reactive to light. Conjunctivae and EOM are normal.  Neck: Normal range of motion. Neck supple. No JVD present. No tracheal deviation present. No thyromegaly present.  Cardiovascular: Normal rate, regular rhythm and normal heart sounds. Exam reveals no gallop and no friction rub.  No murmur heard. Pulmonary/Chest: Effort normal and breath sounds normal. No respiratory distress. She has no wheezes. She has no rales. She exhibits no tenderness.  Abdominal: Soft. Bowel sounds are normal. There is no tenderness.  Musculoskeletal: Normal range of motion.  Lymphadenopathy:    She has no cervical adenopathy.  Neurological: She is alert and oriented to person, place, and time. No cranial nerve deficit.  Skin: Skin is warm and dry. Capillary refill takes less than 2 seconds. She is not diaphoretic.  Psychiatric: She has a normal mood and affect. Her behavior is normal. Judgment and thought content normal.  Nursing note and vitals reviewed.  Assessment/Plan: 1. Intractable migraine with status migrainosus, unspecified migraine type Continue to use abortive therapy as meeded amd as prescribed .  2. Attention and concentration deficit Renew prescriptions for Adderall, both XR and IT. May continue as before. Three 30 day prescriptions were provided for both. Dates are 10/26/2017, 11/24/2017, and 12/23/2017 - amphetamine-dextroamphetamine (ADDERALL XR) 20 MG 24 hr capsule; Take 1 capsule (20 mg total) by mouth daily. Dispense: 30 capsule; Refill: 0 -  amphetamine-dextroamphetamine (ADDERALL) 20 MG tablet; Take 0.5 tablets (10 mg total) by mouth 2 (two) times daily with a meal.  Dispense: 30 tablet; Refill: 0  General Counseling: Dawn Spencer verbalizes understanding of the findings of todays visit and agrees with plan of treatment. I have discussed any further diagnostic evaluation that may be needed or ordered today. We also reviewed her medications today. she has been encouraged to call the office with any questions or concerns that should arise related to todays visit.    Counseling:  This patient was seen by Leretha Pol FNP Collaboration with Dr Lavera Guise as a part of collaborative care agreement  Meds ordered this encounter  Medications  . DISCONTD: amphetamine-dextroamphetamine (ADDERALL XR) 20 MG 24 hr capsule    Sig: Take 1 capsule (20 mg total) by mouth daily. Fill after 08/31/2017    Dispense:  30 capsule    Refill:  0    Order Specific Question:   Supervising Provider    Answer:   Lavera Guise [[id number]]  . DISCONTD: amphetamine-dextroamphetamine (ADDERALL) 20 MG tablet    Sig: Take 0.5 tablets (10 mg total) by mouth 2 (two) times daily with a meal.    Dispense:  30 tablet    Refill:  0    Order Specific Question:   Supervising Provider    Answer:   Lavera Guise [place]  . DISCONTD: amphetamine-dextroamphetamine (ADDERALL XR) 20 MG 24 hr capsule    Sig: Take 1 capsule (20 mg total) by mouth daily. Fill after 08/31/2017    Dispense:  30 capsule    Refill:  0    Fill after 11/24/2017    Order Specific Question:   Supervising Provider    Answer:   Lavera Guise [[id number]]  . DISCONTD: amphetamine-dextroamphetamine (ADDERALL) 20 MG tablet    Sig: Take 0.5 tablets (10 mg total) by mouth 2 (two) times daily with a meal.    Dispense:  30 tablet    Refill:  0    Fill after 11/24/2017    Order Specific Question:   Supervising Provider    Answer:   Lavera Guise [place]  . amphetamine-dextroamphetamine (ADDERALL XR) 20 MG 24 hr  capsule    Sig: Take 1 capsule (20 mg total) by mouth daily. Fill after 08/31/2017    Dispense:  30 capsule    Refill:  0    Fill after 12/23/2017    Order Specific Question:   Supervising Provider    Answer:   Lavera Guise [[id number]]  . amphetamine-dextroamphetamine (ADDERALL) 20 MG tablet    Sig: Take 0.5 tablets (10 mg total) by mouth 2 (two) times daily with a meal.    Dispense:  30 tablet    Refill:  0    Fill after 12/23/2017    Order Specific Question:   Supervising Provider    Answer:   Lavera Guise [[id number]]    Time spent: 24 Minutes     Dr Lavera Guise Internal medicine

## 2018-01-26 ENCOUNTER — Telehealth: Payer: Self-pay

## 2018-01-26 NOTE — Telephone Encounter (Signed)
Called pt and make appt today for med refills

## 2018-01-27 ENCOUNTER — Encounter: Payer: Self-pay | Admitting: Nurse Practitioner

## 2018-01-27 ENCOUNTER — Ambulatory Visit: Payer: Self-pay | Admitting: Nurse Practitioner

## 2018-01-27 VITALS — BP 129/81 | HR 81 | Resp 16 | Ht 60.0 in | Wt 144.0 lb

## 2018-01-27 DIAGNOSIS — R41840 Attention and concentration deficit: Secondary | ICD-10-CM

## 2018-01-27 DIAGNOSIS — G43911 Migraine, unspecified, intractable, with status migrainosus: Secondary | ICD-10-CM

## 2018-01-27 MED ORDER — AMPHETAMINE-DEXTROAMPHETAMINE 30 MG PO TABS: 30.0000 mg | ORAL_TABLET | Freq: Two times a day (BID) | ORAL | 0 refills | Status: DC

## 2018-01-27 NOTE — Progress Notes (Addendum)
[facility] [place], [place] [address]  Internal MEDICINE  Office Visit Note  Patient Name: Dawn Spencer  [id number]  [id number]  Date of Service: 02/07/2018  Chief Complaint  Patient presents with  . Medication Refill    Patient has recently lost her health insurance. Has been unable to afford her usual prescriptions for [place]. In the last month, she has not had this at all. Prescriptions were being filled at [facility], and she was told that they no longer honor the [employer] for controlled medications. Would like to know if we can alter this prescription somehow so that it is more affordable. 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 She is also going to school full-time to get her bachelor's degree in early childhood education. Taking medication for ADD helped her to concentrate and stay on track. Also helped her get better grades in school.       Current Medication: Outpatient Encounter Medications as of 01/27/2018  Medication Sig  . Biotin 10 MG CAPS Take 10 mg by mouth daily.  . Calcium Carb-Cholecalciferol (CALCIUM 600/VITAMIN D3 PO) Take 1 tablet by mouth daily.  . cetirizine (ZYRTEC) 10 MG tablet Take 10 mg by mouth daily.  . Cyanocobalamin (B-12) 5000 MCG CAPS Take by mouth.  . FIBER PO Take 1 capsule by mouth daily. Fiber Well 5 g Probiotic  . HYDROcodone-acetaminophen (NORCO) 5-325 MG tablet Take 1 tablet by mouth every 6 (six) hours as needed.  Marland Kitchen ibuprofen (ADVIL,MOTRIN) 200 MG tablet Take 600-800 mg by mouth every 6 (six) hours as needed for headache or moderate pain.  . Misc Natural Products (COLON CLEANSE PO) Take 1 capsule by mouth daily.  . Multiple Vitamins-Minerals (MULTIVITAMIN GUMMIES WOMENS PO) Take 2 each by mouth daily.  . naproxen sodium (ALEVE) 220 MG tablet Take 440 mg by mouth daily as needed (for pain or headache).  Marland Kitchen omeprazole (PRILOSEC) 20 MG capsule Take 20 mg by mouth  daily.  . promethazine (PHENERGAN) 25 MG tablet Take 1 tablet (25 mg total) by mouth every 6 (six) hours as needed for nausea or vomiting.  . rizatriptan (MAXALT) 10 MG tablet Take 1 tablet (10 mg total) by mouth as needed for migraine.  . SUMAtriptan (IMITREX) 6 MG/0.5ML SOLN injection Inject 0.5 mLs (6 mg total) into the skin every 2 (two) hours as needed for migraine or headache.  . [DISCONTINUED] amphetamine-dextroamphetamine (ADDERALL XR) 20 MG 24 hr capsule Take 1 capsule (20 mg total) by mouth daily. Fill after 08/31/2017  . [DISCONTINUED] amphetamine-dextroamphetamine (ADDERALL) 20 MG tablet Take 0.5 tablets (10 mg total) by mouth 2 (two) times daily with a meal.  . amphetamine-dextroamphetamine (ADDERALL) 30 MG tablet Take 1 tablet by mouth 2 (two) times daily.  . [DISCONTINUED] amphetamine-dextroamphetamine (ADDERALL) 30 MG tablet Take 1 tablet by mouth 2 (two) times daily.  . [DISCONTINUED] amphetamine-dextroamphetamine (ADDERALL) 30 MG tablet Take 1 tablet by mouth 2 (two) times daily.   No facility-administered encounter medications on file as of 01/27/2018.     Surgical History: Past Surgical History:  Procedure Laterality Date  . CESAREAN SECTION  90,95, 97   X3  . HYSTEROSCOPY N/A 12/12/2013   Procedure: HYSTEROSCOPY WITH DILATION;  Surgeon: Donnamae Jude, MD;  Location: [facility];  Service: Gynecology;  Laterality: N/A;  . KNEE ARTHROSCOPY Right 08/06/2017   Procedure: ARTHROSCOPY KNEE;  Surgeon: Frederik Pear, MD;  Location: [facility];  Service: Orthopedics;  Laterality: Right;  medial minsectomy, condral malsia, femeral condil  . TONSILLECTOMY    . VAGINAL HYSTERECTOMY N/A 04/17/2014   Procedure: HYSTERECTOMY VAGINAL / CYSTOSCOPY;  Surgeon: Donnamae Jude, MD;  Location: [facility];  Service: Gynecology;  Laterality: N/A;    Medical History: Past Medical History:  Diagnosis Date  . Allergic rhinitis   . Bleeding from nipple in female 08/25/07  . Complication of anesthesia 1997    difficult spinal - "Couldn't get it to take, but have had one since that was fine"  . Diverticulitis   . Generalized headaches    migraines  . History of insomnia   . PONV (postoperative nausea and vomiting)     Family History: Family History  Problem Relation Age of Onset  . Heart disease Paternal Grandfather   . Cancer Maternal Grandmother        BREAST   . Osteoporosis Mother   . Breast cancer Mother   . Cancer Paternal Aunt        breast caner    Social History   Socioeconomic History  . Marital status: Married    Spouse name: Not on file  . Number of children: Not on file  . Years of education: Not on file  . Highest education level: Not on file  Occupational History  . Not on file  Social Needs  . Financial resource strain: Not on file  . Food insecurity:    Worry: Not on file    Inability: Not on file  . Transportation needs:    Medical: Not on file    Non-medical: Not on file  Tobacco Use  . Smoking status: Never Smoker  . Smokeless tobacco: Never Used  Substance and Sexual Activity  . Alcohol use: Yes    Alcohol/week: 0.0 standard drinks    Comment: occasional  . Drug use: No  . Sexual activity: Yes    Partners: Male    Birth control/protection: Surgical  Lifestyle  . Physical activity:    Days per week: Not on file    Minutes per session: Not on file  . Stress: Not on file  Relationships  . Social connections:    Talks on phone: Not on file    Gets together: Not on file    Attends religious service: Not on file    Active member of club or organization: Not on file    Attends meetings of clubs or organizations: Not on file    Relationship status: Not on file  . Intimate partner violence:    Fear of current or ex partner: Not on file    Emotionally abused: Not on file    Physically abused: Not on file    Forced sexual activity: Not on file  Other Topics Concern  . Not on file  Social History Narrative  . Not on file      Review of  Systems  Constitutional: Negative for activity change, chills, fatigue and unexpected weight change.  HENT: Negative for congestion, postnasal drip, rhinorrhea, sneezing and sore throat.   Eyes: Negative.  Negative for redness.  Respiratory: Negative for cough, chest tightness, shortness of breath and wheezing.   Cardiovascular: Negative for chest pain and palpitations.  Gastrointestinal: Negative for abdominal pain, constipation, diarrhea, nausea and vomiting.  Endocrine: Negative for cold intolerance, heat intolerance, polydipsia, polyphagia and polyuria.  Genitourinary: Negative.  Negative for dysuria and frequency.  Musculoskeletal: Negative for arthralgias, back pain, joint swelling and neck pain.  Skin: Negative for  rash.       Small spot on left side of her forehead she has noted in past month. Does not hurt or itch. Has not changed since she noted it.  Allergic/Immunologic: Negative for environmental allergies.  Neurological: Positive for headaches. Negative for dizziness, tremors and numbness.  Hematological: Negative for adenopathy. Does not bruise/bleed easily.  Psychiatric/Behavioral: Positive for decreased concentration. Negative for behavioral problems (Depression), sleep disturbance and suicidal ideas. The patient is nervous/anxious.     Vital Signs: BP 129/81   Pulse 81   Resp 16   Ht 5' (1.524 m)   Wt 144 lb (65.3 kg)   LMP 12/11/2013   SpO2 100%   BMI 28.12 kg/m    Physical Exam  Constitutional: She is oriented to person, place, and time. She appears well-developed and well-nourished. No distress.  HENT:  Head: Normocephalic and atraumatic.  Nose: Nose normal.  Mouth/Throat: Oropharynx is clear and moist. No oropharyngeal exudate.  Eyes: Pupils are equal, round, and reactive to light. Conjunctivae and EOM are normal.  Neck: Normal range of motion. Neck supple. No JVD present. No tracheal deviation present. No thyromegaly present.  Cardiovascular: Normal rate,  regular rhythm and normal heart sounds. Exam reveals no gallop and no friction rub.  No murmur heard. Pulmonary/Chest: Effort normal and breath sounds normal. No respiratory distress. She has no wheezes. She has no rales. She exhibits no tenderness.  Abdominal: Soft. Bowel sounds are normal. There is no tenderness.  Musculoskeletal: Normal range of motion.  Lymphadenopathy:    She has no cervical adenopathy.  Neurological: She is alert and oriented to person, place, and time. No cranial nerve deficit.  Skin: Skin is warm and dry. Capillary refill takes less than 2 seconds. She is not diaphoretic.  Psychiatric: Her speech is normal and behavior is normal. Judgment and thought content normal. Her mood appears anxious. Cognition and memory are normal.  Nursing note and vitals reviewed.  Assessment/Plan:  1. Intractable migraine with status migrainosus, unspecified migraine type May use imitrex as needed and as prescribed for acute migraine headaches.   2. Attention and concentration deficit D/c Adderall XR 20mg  and adderall 20mg  IR. Will start Adderall 30mg  twice daily as needed. Three 30 day prescriptions provided. Dates are 01/27/2018, 02/25/2018, and 03/25/2018.  - amphetamine-dextroamphetamine (ADDERALL) 30 MG tablet; Take 1 tablet by mouth 2 (two) times daily.  Dispense: 60 tablet; Refill: 0  General Counseling: [place] verbalizes understanding of the findings of todays visit and agrees with plan of treatment. I have discussed any further diagnostic evaluation that may be needed or ordered today. We also reviewed her medications today. she has been encouraged to call the office with any questions or concerns that should arise related to todays visit.  Refilled Controlled medications today. Reviewed risks and possible side effects associated with taking Stimulants. Combination of these drugs with other psychotropic medications could cause dizziness and drowsiness. Pt needs to Monitor symptoms  and exercise caution in driving and operating heavy machinery to avoid damages to oneself, to others and to the surroundings. Patient verbalized understanding in this matter. Dependence and abuse for these drugs will be monitored closely. A Controlled substance policy and procedure is on file which allows [facility] to order a urine drug screen test at any visit. Patient understands and agrees with the plan..  This patient was seen by Leretha Pol FNP Collaboration with Dr Lavera Guise as a part of collaborative care agreement  Meds ordered this encounter  Medications  . DISCONTD: amphetamine-dextroamphetamine (ADDERALL) 30 MG tablet    Sig: Take 1 tablet by mouth 2 (two) times daily.    Dispense:  60 tablet    Refill:  0    Order Specific Question:   Supervising Provider    Answer:   Lavera Guise [[id number]]  . DISCONTD: amphetamine-dextroamphetamine (ADDERALL) 30 MG tablet    Sig: Take 1 tablet by mouth 2 (two) times daily.    Dispense:  60 tablet    Refill:  0    Fill after 02/25/2018    Order Specific Question:   Supervising Provider    Answer:   Lavera Guise [[id number]]  . amphetamine-dextroamphetamine (ADDERALL) 30 MG tablet    Sig: Take 1 tablet by mouth 2 (two) times daily.    Dispense:  60 tablet    Refill:  0    Fill after 03/25/2018    Order Specific Question:   Supervising Provider    Answer:   Lavera Guise [[id number]]    Time spent: 48 Minutes  Dr Lavera Guise Internal medicine

## 2018-02-01 ENCOUNTER — Ambulatory Visit: Payer: Self-pay | Admitting: Nurse Practitioner

## 2018-04-29 ENCOUNTER — Ambulatory Visit: Payer: Self-pay | Admitting: Nurse Practitioner

## 2018-05-20 ENCOUNTER — Ambulatory Visit: Payer: Self-pay | Admitting: Nurse Practitioner

## 2018-05-20 ENCOUNTER — Encounter: Payer: Self-pay | Admitting: Nurse Practitioner

## 2018-05-20 VITALS — BP 120/80 | HR 88 | Resp 16 | Ht 60.0 in | Wt 164.0 lb

## 2018-05-20 DIAGNOSIS — G43911 Migraine, unspecified, intractable, with status migrainosus: Secondary | ICD-10-CM

## 2018-05-20 DIAGNOSIS — G8929 Other chronic pain: Secondary | ICD-10-CM

## 2018-05-20 DIAGNOSIS — M25511 Pain in right shoulder: Secondary | ICD-10-CM

## 2018-05-20 DIAGNOSIS — R41840 Attention and concentration deficit: Secondary | ICD-10-CM

## 2018-05-20 MED ORDER — AMPHETAMINE-DEXTROAMPHETAMINE 30 MG PO TABS: 30.0000 mg | ORAL_TABLET | Freq: Two times a day (BID) | ORAL | 0 refills | Status: DC

## 2018-05-20 MED ORDER — CYCLOBENZAPRINE HCL 10 MG PO TABS: 10.0000 mg | ORAL_TABLET | Freq: Every day | ORAL | 2 refills | Status: DC

## 2018-05-20 MED ORDER — RIZATRIPTAN BENZOATE 10 MG PO TABS: 10.0000 mg | ORAL_TABLET | ORAL | 5 refills | Status: DC | PRN

## 2018-05-20 NOTE — Progress Notes (Signed)
[facility], [place] [address]  Internal MEDICINE  Office Visit Note  Patient Name: Dawn Spencer  [id number]  [id number]  Date of Service: 05/29/2018  Chief Complaint  Patient presents with  . ADD    The patient is currently employed full time and is in school for her bachelor's degree. She is currently taking  adderall 30mg  twice daily when needed. ay.  She is able to stay focused and on track while taking this medication. She is here to day to get refills for these medications.       Current Medication: Outpatient Encounter Medications as of 05/20/2018  Medication Sig  . amphetamine-dextroamphetamine (ADDERALL) 30 MG tablet Take 1 tablet by mouth 2 (two) times daily.  . Biotin 10 MG CAPS Take 10 mg by mouth daily.  . Calcium Carb-Cholecalciferol (CALCIUM 600/VITAMIN D3 PO) Take 1 tablet by mouth daily.  . cetirizine (ZYRTEC) 10 MG tablet Take 10 mg by mouth daily.  . Cyanocobalamin (B-12) 5000 MCG CAPS Take by mouth.  . FIBER PO Take 1 capsule by mouth daily. Fiber Well 5 g Probiotic  . ibuprofen (ADVIL,MOTRIN) 200 MG tablet Take 600-800 mg by mouth every 6 (six) hours as needed for headache or moderate pain.  . Multiple Vitamins-Minerals (MULTIVITAMIN GUMMIES WOMENS PO) Take 2 each by mouth daily.  . naproxen sodium (ALEVE) 220 MG tablet Take 440 mg by mouth daily as needed (for pain or headache).  Marland Kitchen omeprazole (PRILOSEC) 20 MG capsule Take 20 mg by mouth daily.  . promethazine (PHENERGAN) 25 MG tablet Take 1 tablet (25 mg total) by mouth every 6 (six) hours as needed for nausea or vomiting.  . rizatriptan (MAXALT) 10 MG tablet Take 1 tablet (10 mg total) by mouth as needed for migraine.  . SUMAtriptan (IMITREX) 6 MG/0.5ML SOLN injection Inject 0.5 mLs (6 mg total) into the skin every 2 (two) hours as needed for migraine or headache.  . [DISCONTINUED] amphetamine-dextroamphetamine (ADDERALL) 30 MG tablet Take 1 tablet by mouth 2 (two) times  daily.  . [DISCONTINUED] amphetamine-dextroamphetamine (ADDERALL) 30 MG tablet Take 1 tablet by mouth 2 (two) times daily.  . [DISCONTINUED] amphetamine-dextroamphetamine (ADDERALL) 30 MG tablet Take 1 tablet by mouth 2 (two) times daily.  . [DISCONTINUED] Misc Natural Products (COLON CLEANSE PO) Take 1 capsule by mouth daily.  . [DISCONTINUED] rizatriptan (MAXALT) 10 MG tablet Take 1 tablet (10 mg total) by mouth as needed for migraine.  . cyclobenzaprine (FLEXERIL) 10 MG tablet Take 1 tablet (10 mg total) by mouth at bedtime.  Marland Kitchen HYDROcodone-acetaminophen (NORCO) 5-325 MG tablet Take 1 tablet by mouth every 6 (six) hours as needed. (Patient not taking: Reported on 05/20/2018)   No facility-administered encounter medications on file as of 05/20/2018.     Surgical History: Past Surgical History:  Procedure Laterality Date  . CESAREAN SECTION  90,95, 97   X3  . HYSTEROSCOPY N/A 12/12/2013   Procedure: HYSTEROSCOPY WITH DILATION;  Surgeon: Donnamae Jude, MD;  Location: [facility];  Service: Gynecology;  Laterality: N/A;  . KNEE ARTHROSCOPY Right 08/06/2017   Procedure: ARTHROSCOPY KNEE;  Surgeon: Frederik Pear, MD;  Location: [facility];  Service: Orthopedics;  Laterality: Right;  medial minsectomy, condral malsia, femeral condil  . TONSILLECTOMY    . VAGINAL HYSTERECTOMY N/A 04/17/2014   Procedure: HYSTERECTOMY VAGINAL / CYSTOSCOPY;  Surgeon: Donnamae Jude, MD;  Location: [facility];  Service: Gynecology;  Laterality: N/A;    Medical History: Past Medical History:  Diagnosis Date  . ADD (attention deficit disorder)   . Allergic rhinitis   . Bleeding from nipple in female 08/25/07  . Complication of anesthesia 1997   difficult spinal - "Couldn't get it to take, but have had one since that was fine"  . Diverticulitis   . Generalized headaches    migraines  . History of insomnia   . PONV (postoperative nausea and vomiting)     Family History: Family History  Problem Relation Age of Onset  . Heart  disease Paternal Grandfather   . Cancer Maternal Grandmother        BREAST   . Osteoporosis Mother   . Breast cancer Mother   . Cancer Paternal Aunt        breast caner    Social History   Socioeconomic History  . Marital status: Married    Spouse name: Not on file  . Number of children: Not on file  . Years of education: Not on file  . Highest education level: Not on file  Occupational History  . Not on file  Social Needs  . Financial resource strain: Not on file  . Food insecurity:    Worry: Not on file    Inability: Not on file  . Transportation needs:    Medical: Not on file    Non-medical: Not on file  Tobacco Use  . Smoking status: Never Smoker  . Smokeless tobacco: Never Used  Substance and Sexual Activity  . Alcohol use: Yes    Alcohol/week: 0.0 standard drinks    Comment: occasional  . Drug use: No  . Sexual activity: Yes    Partners: Male    Birth control/protection: Surgical  Lifestyle  . Physical activity:    Days per week: Not on file    Minutes per session: Not on file  . Stress: Not on file  Relationships  . Social connections:    Talks on phone: Not on file    Gets together: Not on file    Attends religious service: Not on file    Active member of club or organization: Not on file    Attends meetings of clubs or organizations: Not on file    Relationship status: Not on file  . Intimate partner violence:    Fear of current or ex partner: Not on file    Emotionally abused: Not on file    Physically abused: Not on file    Forced sexual activity: Not on file  Other Topics Concern  . Not on file  Social History Narrative  . Not on file      Review of Systems  Constitutional: Negative for activity change, chills, fatigue and unexpected weight change.  HENT: Negative for congestion, postnasal drip, rhinorrhea, sneezing and sore throat.   Respiratory: Negative for cough, chest tightness, shortness of breath and wheezing.   Cardiovascular:  Negative for chest pain and palpitations.  Gastrointestinal: Negative for abdominal pain, constipation, diarrhea, nausea and vomiting.  Endocrine: Negative for cold intolerance, heat intolerance, polydipsia and polyuria.  Musculoskeletal: Negative for arthralgias, back pain, joint swelling and neck pain.  Skin: Negative for rash.  Allergic/Immunologic: Negative for environmental allergies.  Neurological: Positive for headaches. Negative for dizziness, tremors and numbness.  Hematological: Negative for adenopathy. Does not bruise/bleed easily.  Psychiatric/Behavioral: Positive for decreased concentration. Negative for behavioral problems (Depression), sleep disturbance and suicidal ideas. The patient is nervous/anxious.    Today's Vitals   05/20/18 1529  BP: 120/80  Pulse: 88  Resp: 16  SpO2: 98%  Weight: 164 lb (74.4 kg)  Height: 5' (1.524 m)   Body mass index is 32.03 kg/m.  Physical Exam Vitals signs and nursing note reviewed.  Constitutional:      General: She is not in acute distress.    Appearance: Normal appearance. She is well-developed. She is not diaphoretic.  HENT:     Head: Normocephalic and atraumatic.     Nose: Nose normal.     Mouth/Throat:     Pharynx: No oropharyngeal exudate.  Eyes:     Conjunctiva/sclera: Conjunctivae normal.     Pupils: Pupils are equal, round, and reactive to light.  Neck:     Musculoskeletal: Normal range of motion and neck supple.     Thyroid: No thyromegaly.     Vascular: No JVD.     Trachea: No tracheal deviation.  Cardiovascular:     Rate and Rhythm: Normal rate and regular rhythm.     Heart sounds: Normal heart sounds. No murmur. No friction rub. No gallop.   Pulmonary:     Effort: Pulmonary effort is normal. No respiratory distress.     Breath sounds: Normal breath sounds. No wheezing or rales.  Chest:     Chest wall: No tenderness.  Abdominal:     General: Bowel sounds are normal.     Palpations: Abdomen is soft.      Tenderness: There is no abdominal tenderness.  Musculoskeletal: Normal range of motion.  Lymphadenopathy:     Cervical: No cervical adenopathy.  Skin:    General: Skin is warm and dry.     Capillary Refill: Capillary refill takes less than 2 seconds.  Neurological:     Mental Status: She is alert and oriented to person, place, and time.     Cranial Nerves: No cranial nerve deficit.  Psychiatric:        Mood and Affect: Mood is anxious.        Speech: Speech normal.        Behavior: Behavior normal.        Thought Content: Thought content normal.        Judgment: Judgment normal.   Assessment/Plan: 1. Chronic right shoulder pain May use flexeril 10mg  at bedtime as needed for muscle pain and tightness.  - cyclobenzaprine (FLEXERIL) 10 MG tablet; Take 1 tablet (10 mg total) by mouth at bedtime.  Dispense: 30 tablet; Refill: 2  2. Attention and concentration deficit May continue to use adderall 30mg  tablets twice daily as needed for focus and concentration. Three 30 day prescriptions were sent to pharmacy. Dates are 05/20/2018, 06/18/2018, and 07/16/2018 - amphetamine-dextroamphetamine (ADDERALL) 30 MG tablet; Take 1 tablet by mouth 2 (two) times daily.  Dispense: 60 tablet; Refill: 0  3. Intractable migraine with status migrainosus, unspecified migraine type Renew maxalt as needed and as prescribed for acute migraine headachdes.  - rizatriptan (MAXALT) 10 MG tablet; Take 1 tablet (10 mg total) by mouth as needed for migraine.  Dispense: 10 tablet; Refill: 5  General Counseling: Jazari verbalizes understanding of the findings of todays visit and agrees with plan of treatment. I have discussed any further diagnostic evaluation that may be needed or ordered today. We also reviewed her medications today. she has been encouraged to call the office with any questions or concerns that should arise related to todays visit.  Refilled Controlled medications today. Reviewed risks and possible side  effects associated with taking Stimulants. Combination of these drugs with other psychotropic medications could  cause dizziness and drowsiness. Pt needs to Monitor symptoms and exercise caution in driving and operating heavy machinery to avoid damages to oneself, to others and to the surroundings. Patient verbalized understanding in this matter. Dependence and abuse for these drugs will be monitored closely. A Controlled substance policy and procedure is on file which allows [facility] to order a urine drug screen test at any visit. Patient understands and agrees with the plan..  This patient was seen by Leretha Pol FNP Collaboration with Dr Lavera Guise as a part of collaborative care agreement  Meds ordered this encounter  Medications  . DISCONTD: amphetamine-dextroamphetamine (ADDERALL) 30 MG tablet    Sig: Take 1 tablet by mouth 2 (two) times daily.    Dispense:  60 tablet    Refill:  0    Fill after 03/25/2018    Order Specific Question:   Supervising Provider    Answer:   Lavera Guise [[id number]]  . cyclobenzaprine (FLEXERIL) 10 MG tablet    Sig: Take 1 tablet (10 mg total) by mouth at bedtime.    Dispense:  30 tablet    Refill:  2    Order Specific Question:   Supervising Provider    Answer:   Lavera Guise [[id number]]  . rizatriptan (MAXALT) 10 MG tablet    Sig: Take 1 tablet (10 mg total) by mouth as needed for migraine.    Dispense:  10 tablet    Refill:  5    Order Specific Question:   Supervising Provider    Answer:   Lavera Guise [[id number]]  . DISCONTD: amphetamine-dextroamphetamine (ADDERALL) 30 MG tablet    Sig: Take 1 tablet by mouth 2 (two) times daily.    Dispense:  60 tablet    Refill:  0    Fill after 06/18/2018    Order Specific Question:   Supervising Provider    Answer:   Lavera Guise [[id number]]  . amphetamine-dextroamphetamine (ADDERALL) 30 MG tablet    Sig: Take 1 tablet by mouth 2 (two) times daily.    Dispense:  60 tablet    Refill:  0    Fill after  07/16/2018    Order Specific Question:   Supervising Provider    Answer:   Lavera Guise [[id number]]    Time spent: 25 Minutes      Dr Lavera Guise Internal medicine

## 2018-08-19 ENCOUNTER — Encounter: Payer: Self-pay | Admitting: Nurse Practitioner

## 2018-08-19 ENCOUNTER — Other Ambulatory Visit: Payer: Self-pay

## 2018-08-19 ENCOUNTER — Ambulatory Visit: Payer: Self-pay | Admitting: Nurse Practitioner

## 2018-08-19 VITALS — Ht 60.0 in | Wt 160.0 lb

## 2018-08-19 DIAGNOSIS — R41840 Attention and concentration deficit: Secondary | ICD-10-CM

## 2018-08-19 DIAGNOSIS — G43911 Migraine, unspecified, intractable, with status migrainosus: Secondary | ICD-10-CM

## 2018-08-19 MED ORDER — AMPHETAMINE-DEXTROAMPHETAMINE 30 MG PO TABS: 30.0000 mg | ORAL_TABLET | Freq: Two times a day (BID) | ORAL | 0 refills | Status: DC

## 2018-08-19 NOTE — Progress Notes (Signed)
[facility] [place], [place] [address]  Internal MEDICINE  Telephone Visit  Patient Name: Dawn Spencer  [id number]  [id number]  Date of Service: 09/06/2018  I connected with the patient at 2:03pm by webcam and verified the patients identity using two identifiers.   I discussed the limitations, risks, security and privacy concerns of performing an evaluation and management service by webcam and the availability of in person appointments. I also discussed with the patient that there may be a patient responsible charge related to the service.  The patient expressed understanding and agrees to proceed.    Chief Complaint  Patient presents with  . Telephone Assessment  . Telephone Screen  . ADD    The patient has been contacted via webcam for follow up visit due to concerns for spread of novel coronavirus. The patient is currently employed full time and is in school for her bachelor's degree. She is currently taking  adderall 30mg  twice daily when needed. She is able to stay focused and on track while taking this medication. She is here to day to get refills for these medications.       Current Medication: Outpatient Encounter Medications as of 08/19/2018  Medication Sig  . amphetamine-dextroamphetamine (ADDERALL) 30 MG tablet Take 1 tablet by mouth 2 (two) times daily.  . Biotin 10 MG CAPS Take 10 mg by mouth daily.  . Calcium Carb-Cholecalciferol (CALCIUM 600/VITAMIN D3 PO) Take 1 tablet by mouth daily.  . cetirizine (ZYRTEC) 10 MG tablet Take 10 mg by mouth daily.  . Cyanocobalamin (B-12) 5000 MCG CAPS Take by mouth.  . cyclobenzaprine (FLEXERIL) 10 MG tablet Take 1 tablet (10 mg total) by mouth at bedtime.  Marland Kitchen FIBER PO Take 1 capsule by mouth daily. Fiber Well 5 g Probiotic  . ibuprofen (ADVIL,MOTRIN) 200 MG tablet Take 600-800 mg by mouth every 6 (six) hours as needed for headache or moderate pain.  . Multiple Vitamins-Minerals (MULTIVITAMIN GUMMIES  WOMENS PO) Take 2 each by mouth daily.  . naproxen sodium (ALEVE) 220 MG tablet Take 440 mg by mouth daily as needed (for pain or headache).  Marland Kitchen omeprazole (PRILOSEC) 20 MG capsule Take 20 mg by mouth daily.  . promethazine (PHENERGAN) 25 MG tablet Take 1 tablet (25 mg total) by mouth every 6 (six) hours as needed for nausea or vomiting.  . rizatriptan (MAXALT) 10 MG tablet Take 1 tablet (10 mg total) by mouth as needed for migraine.  . SUMAtriptan (IMITREX) 6 MG/0.5ML SOLN injection Inject 0.5 mLs (6 mg total) into the skin every 2 (two) hours as needed for migraine or headache.  . [DISCONTINUED] amphetamine-dextroamphetamine (ADDERALL) 30 MG tablet Take 1 tablet by mouth 2 (two) times daily.  . [DISCONTINUED] amphetamine-dextroamphetamine (ADDERALL) 30 MG tablet Take 1 tablet by mouth 2 (two) times daily.  . [DISCONTINUED] amphetamine-dextroamphetamine (ADDERALL) 30 MG tablet Take 1 tablet by mouth 2 (two) times daily.  Marland Kitchen HYDROcodone-acetaminophen (NORCO) 5-325 MG tablet Take 1 tablet by mouth every 6 (six) hours as needed. (Patient not taking: Reported on 05/20/2018)   No facility-administered encounter medications on file as of 08/19/2018.     Surgical History: Past Surgical History:  Procedure Laterality Date  . CESAREAN SECTION  90,95, 97   X3  . HYSTEROSCOPY N/A 12/12/2013   Procedure: HYSTEROSCOPY WITH DILATION;  Surgeon: Donnamae Jude, MD;  Location: [facility];  Service: Gynecology;  Laterality: N/A;  . KNEE ARTHROSCOPY Right 08/06/2017   Procedure: ARTHROSCOPY KNEE;  Surgeon:  Frederik Pear, MD;  Location: [facility];  Service: Orthopedics;  Laterality: Right;  medial minsectomy, condral malsia, femeral condil  . TONSILLECTOMY    . VAGINAL HYSTERECTOMY N/A 04/17/2014   Procedure: HYSTERECTOMY VAGINAL / CYSTOSCOPY;  Surgeon: Donnamae Jude, MD;  Location: [facility];  Service: Gynecology;  Laterality: N/A;    Medical History: Past Medical History:  Diagnosis Date  . ADD (attention deficit  disorder)   . Allergic rhinitis   . Bleeding from nipple in female 08/25/07  . Complication of anesthesia 1997   difficult spinal - "Couldn't get it to take, but have had one since that was fine"  . Diverticulitis   . Generalized headaches    migraines  . History of insomnia   . PONV (postoperative nausea and vomiting)     Family History: Family History  Problem Relation Age of Onset  . Heart disease Paternal Grandfather   . Cancer Maternal Grandmother        BREAST   . Osteoporosis Mother   . Breast cancer Mother   . Cancer Paternal Aunt        breast caner    Social History   Socioeconomic History  . Marital status: Married    Spouse name: Not on file  . Number of children: Not on file  . Years of education: Not on file  . Highest education level: Not on file  Occupational History  . Not on file  Social Needs  . Financial resource strain: Not on file  . Food insecurity:    Worry: Not on file    Inability: Not on file  . Transportation needs:    Medical: Not on file    Non-medical: Not on file  Tobacco Use  . Smoking status: Never Smoker  . Smokeless tobacco: Never Used  Substance and Sexual Activity  . Alcohol use: Yes    Alcohol/week: 0.0 standard drinks    Comment: occasional  . Drug use: No  . Sexual activity: Yes    Partners: Male    Birth control/protection: Surgical  Lifestyle  . Physical activity:    Days per week: Not on file    Minutes per session: Not on file  . Stress: Not on file  Relationships  . Social connections:    Talks on phone: Not on file    Gets together: Not on file    Attends religious service: Not on file    Active member of club or organization: Not on file    Attends meetings of clubs or organizations: Not on file    Relationship status: Not on file  . Intimate partner violence:    Fear of current or ex partner: Not on file    Emotionally abused: Not on file    Physically abused: Not on file    Forced sexual activity:  Not on file  Other Topics Concern  . Not on file  Social History Narrative  . Not on file      Review of Systems  Constitutional: Negative for activity change, chills, fatigue and unexpected weight change.  HENT: Negative for congestion, postnasal drip, rhinorrhea, sneezing and sore throat.   Respiratory: Negative for cough, chest tightness, shortness of breath and wheezing.   Cardiovascular: Negative for chest pain and palpitations.  Gastrointestinal: Negative for abdominal pain, constipation, diarrhea, nausea and vomiting.  Endocrine: Negative for cold intolerance, heat intolerance, polydipsia and polyuria.  Musculoskeletal: Negative for arthralgias, back pain, joint swelling and neck pain.  Skin: Negative  for rash.  Allergic/Immunologic: Negative for environmental allergies.  Neurological: Positive for headaches. Negative for dizziness, tremors and numbness.  Hematological: Negative for adenopathy. Does not bruise/bleed easily.  Psychiatric/Behavioral: Positive for decreased concentration. Negative for behavioral problems (Depression), sleep disturbance and suicidal ideas. The patient is nervous/anxious.     Today's Vitals   08/19/18 1354  Weight: 160 lb (72.6 kg)  Height: 5' (1.524 m)   Body mass index is 31.25 kg/m.  Observation/Objective:   The patient is alert and oriented. She is pleasant and answers all questions appropriately. Breathing is non-labored. She is in no acute distress at this time.    Assessment/Plan: 1. Intractable migraine with status migrainosus, unspecified migraine type Overall, well managed. Continue imitrex and maxalt as needed for acute migraine headaches.  2. Attention and concentration deficit Ok to continue adderall 30mg  twice daily as needed for focus/concentration. Three 30 day prescriptions sent to her pharmacy. Dates are 08/19/2018, 09/17/2018, and 10/16/2018.  - amphetamine-dextroamphetamine (ADDERALL) 30 MG tablet; Take 1 tablet by mouth  2 (two) times daily.  Dispense: 60 tablet; Refill: 0  General Counseling: Jolyne verbalizes understanding of the findings of today's phone visit and agrees with plan of treatment. I have discussed any further diagnostic evaluation that may be needed or ordered today. We also reviewed her medications today. she has been encouraged to call the office with any questions or concerns that should arise related to todays visit.   Refilled Controlled medications today. Reviewed risks and possible side effects associated with taking Stimulants. Combination of these drugs with other psychotropic medications could cause dizziness and drowsiness. Pt needs to Monitor symptoms and exercise caution in driving and operating heavy machinery to avoid damages to oneself, to others and to the surroundings. Patient verbalized understanding in this matter. Dependence and abuse for these drugs will be monitored closely. A Controlled substance policy and procedure is on file which allows [facility] to order a urine drug screen test at any visit. Patient understands and agrees with the plan..  This patient was seen by Leretha Pol FNP Collaboration with Dr Lavera Guise as a part of collaborative care agreement  Meds ordered this encounter  Medications  . DISCONTD: amphetamine-dextroamphetamine (ADDERALL) 30 MG tablet    Sig: Take 1 tablet by mouth 2 (two) times daily.    Dispense:  60 tablet    Refill:  0    Order Specific Question:   Supervising Provider    Answer:   Lavera Guise [[id number]]  . DISCONTD: amphetamine-dextroamphetamine (ADDERALL) 30 MG tablet    Sig: Take 1 tablet by mouth 2 (two) times daily.    Dispense:  60 tablet    Refill:  0    Fill after 09/15/2018    Order Specific Question:   Supervising Provider    Answer:   Lavera Guise [[id number]]  . amphetamine-dextroamphetamine (ADDERALL) 30 MG tablet    Sig: Take 1 tablet by mouth 2 (two) times daily.    Dispense:  60 tablet    Refill:  0     Fill after 10/14/2018    Order Specific Question:   Supervising Provider    Answer:   Lavera Guise [[id number]]    Time spent: 76 Minutes    Dr Lavera Guise Internal medicine

## 2018-11-22 ENCOUNTER — Ambulatory Visit: Payer: Self-pay | Admitting: Adult Health

## 2019-05-17 IMAGING — DX DG KNEE COMPLETE 4+V*R*
4 series · 4 of 4 positions shown · non-contrast
Comparison: None.

CLINICAL DATA: Pain following fall

EXAM:
RIGHT KNEE - COMPLETE 4+ VIEW

[knee pa]
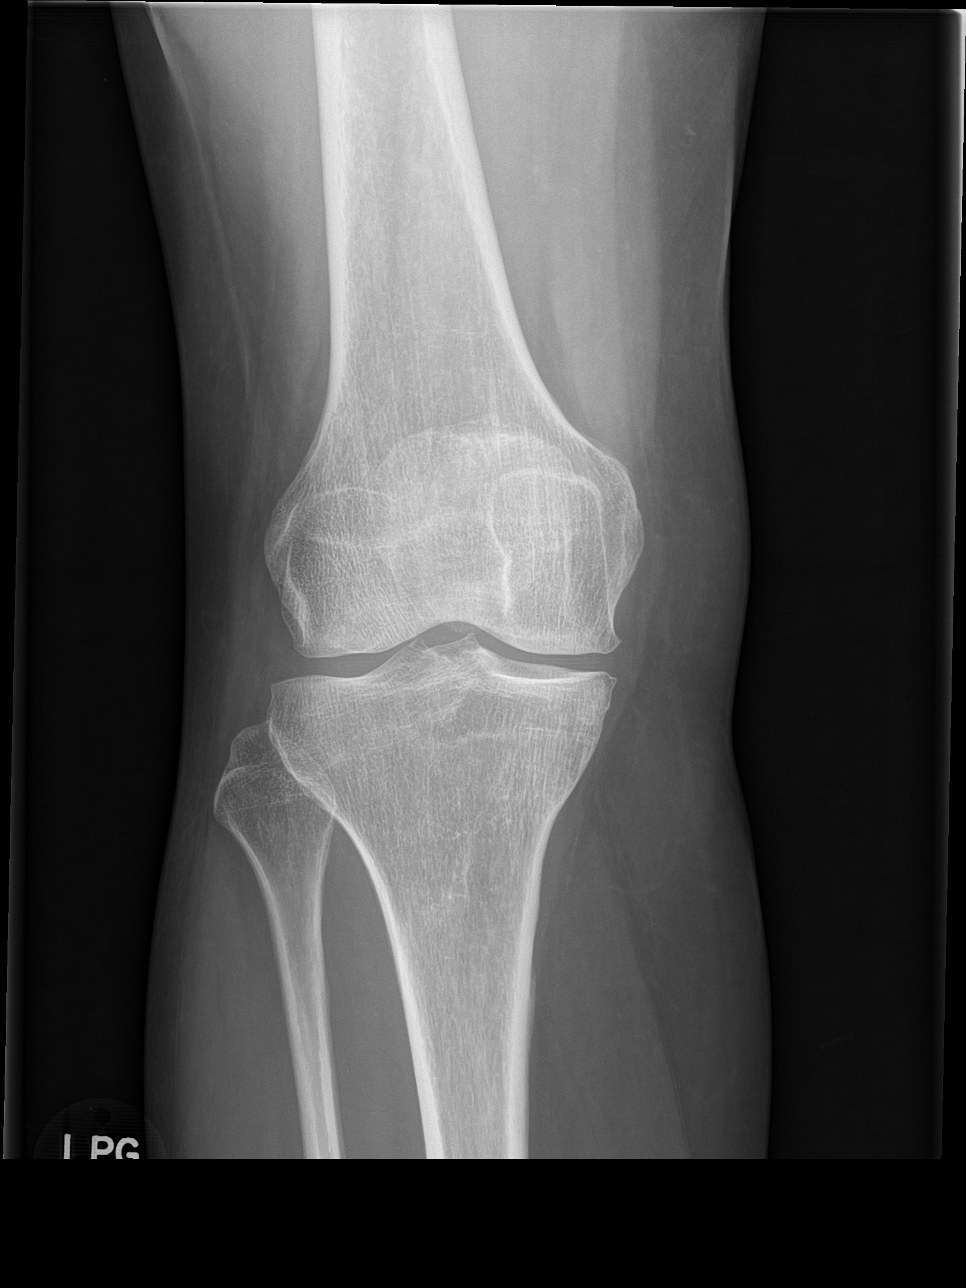

[knee obl (1 of 2)]
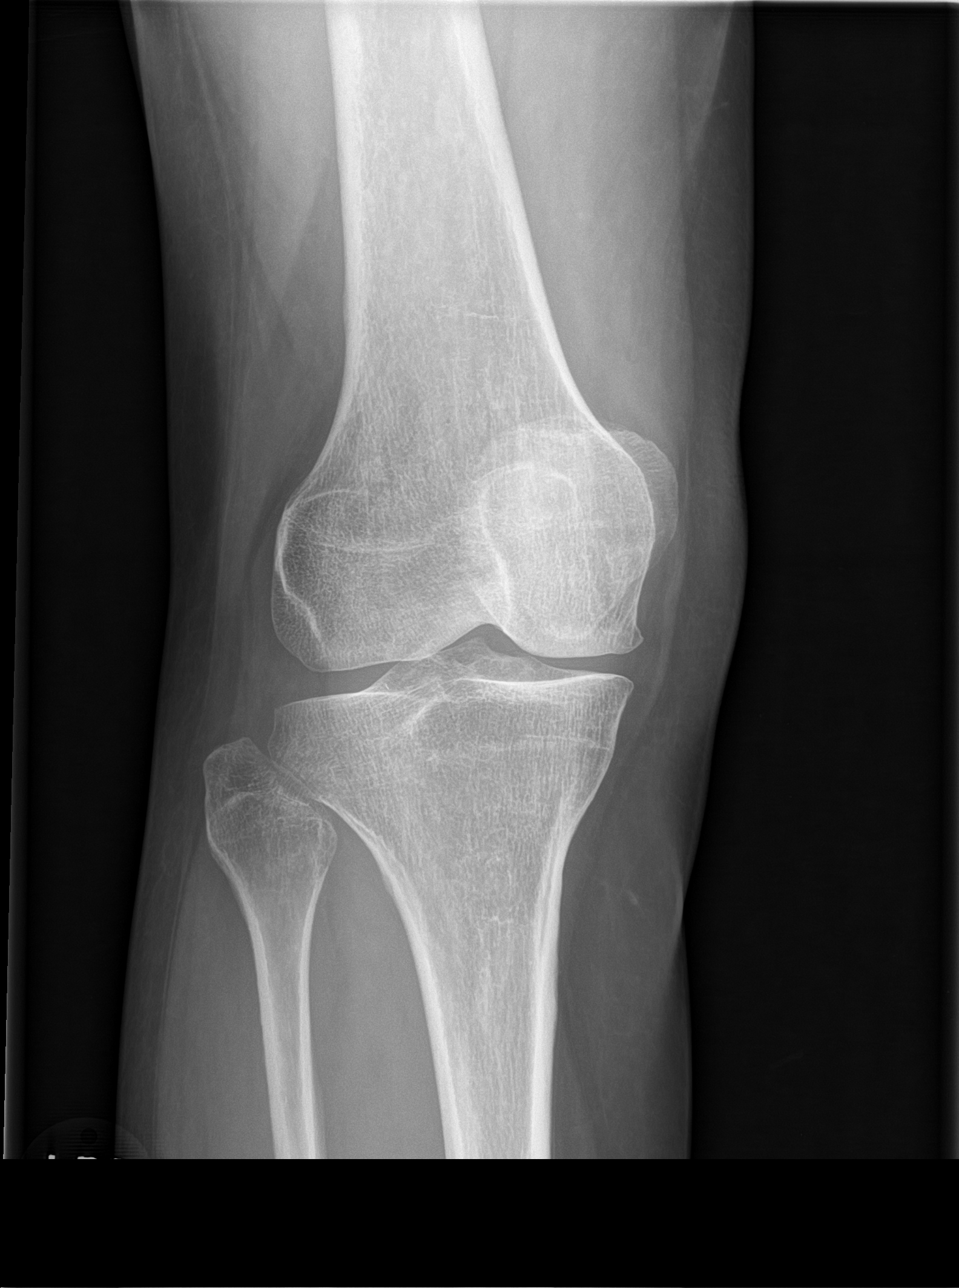

[knee obl (2 of 2)]
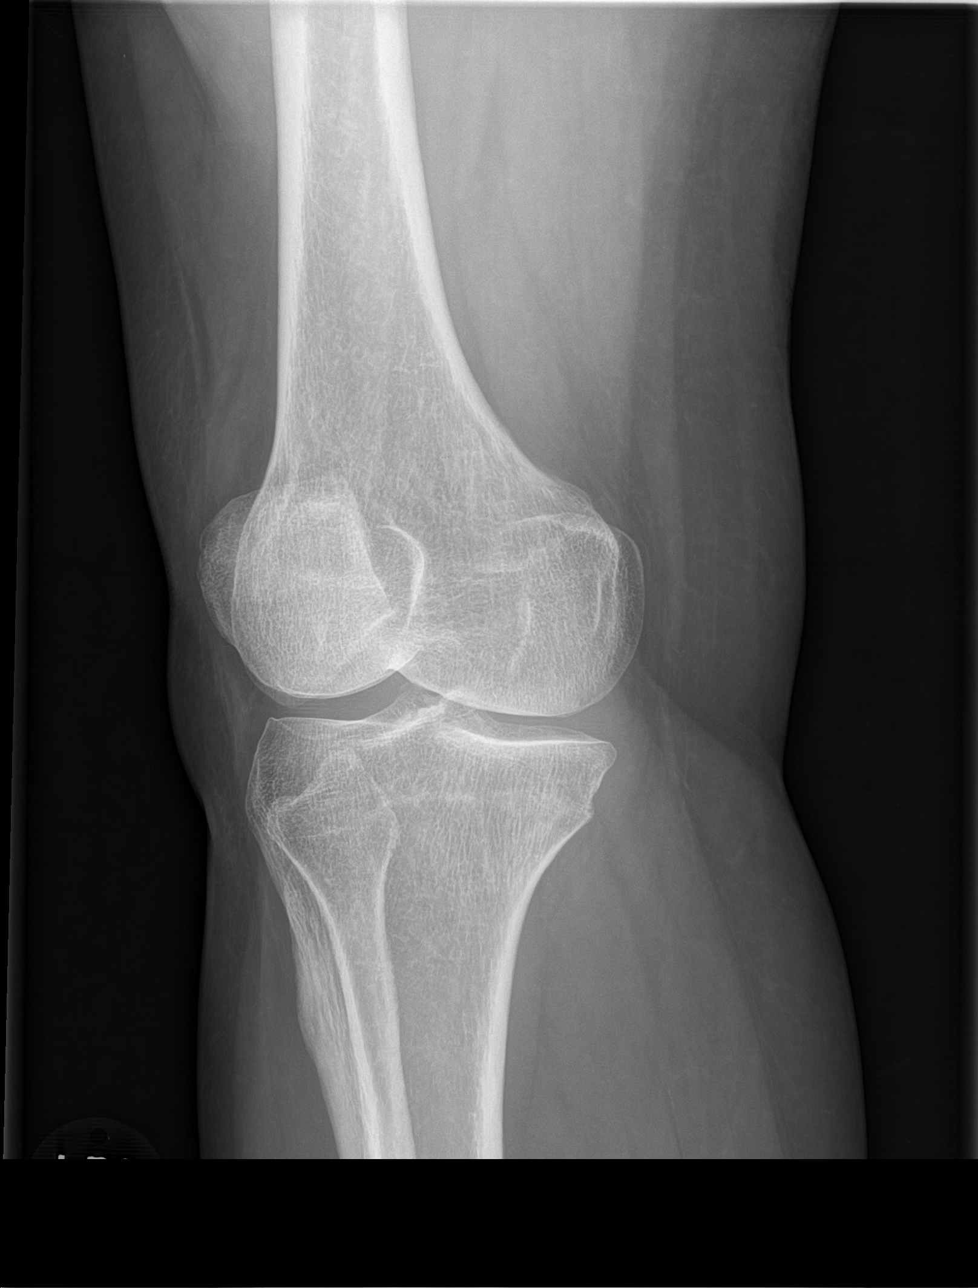

[knee lat]
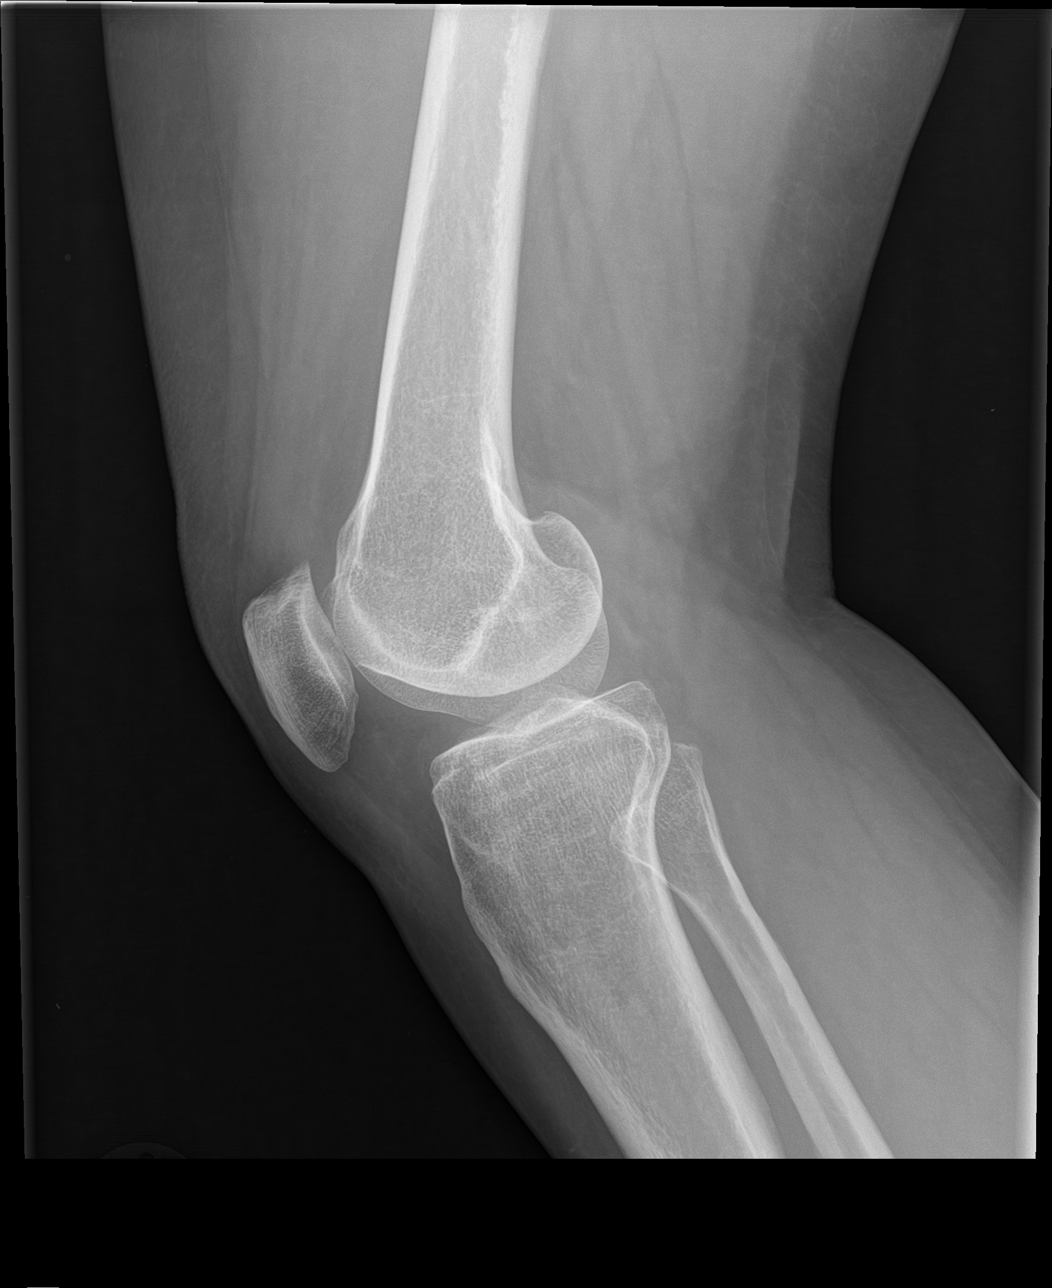

[4 of 4 positions shown; findings below may reference images not displayed]

FINDINGS: Frontal, lateral, and bilateral oblique views were obtained. There
is no fracture or dislocation. No joint effusion. The joint spaces
appear normal. There is slight spurring medially and arising from
the posterior patella. No erosive change.
IMPRESSION: Rather minimal osteoarthritic change. No fracture or dislocation. No
evident joint effusion.

## 2019-07-28 ENCOUNTER — Other Ambulatory Visit: Payer: Self-pay

## 2019-07-28 ENCOUNTER — Ambulatory Visit (LOCAL_COMMUNITY_HEALTH_CENTER): Payer: Self-pay

## 2019-07-28 DIAGNOSIS — Z111 Encounter for screening for respiratory tuberculosis: Secondary | ICD-10-CM

## 2019-07-31 ENCOUNTER — Other Ambulatory Visit: Payer: Self-pay

## 2019-07-31 ENCOUNTER — Ambulatory Visit (LOCAL_COMMUNITY_HEALTH_CENTER): Payer: Self-pay

## 2019-07-31 DIAGNOSIS — Z111 Encounter for screening for respiratory tuberculosis: Secondary | ICD-10-CM

## 2019-07-31 LAB — TB SKIN TEST
Induration: 0 mm
TB Skin Test: NEGATIVE

## 2019-09-21 ENCOUNTER — Telehealth: Payer: Self-pay

## 2019-09-21 NOTE — Telephone Encounter (Signed)
Lmom to confirm and screen for 09-25-19 ov.

## 2019-09-25 ENCOUNTER — Ambulatory Visit (INDEPENDENT_AMBULATORY_CARE_PROVIDER_SITE_OTHER): Payer: BC Managed Care – PPO | Admitting: Nurse Practitioner

## 2019-09-25 ENCOUNTER — Other Ambulatory Visit: Payer: Self-pay

## 2019-09-25 ENCOUNTER — Encounter: Payer: Self-pay | Admitting: Nurse Practitioner

## 2019-09-25 VITALS — BP 131/75 | HR 87 | Temp 97.4°F | Resp 16 | Ht 60.0 in | Wt 187.6 lb

## 2019-09-25 DIAGNOSIS — R41840 Attention and concentration deficit: Secondary | ICD-10-CM

## 2019-09-25 DIAGNOSIS — Z1211 Encounter for screening for malignant neoplasm of colon: Secondary | ICD-10-CM | POA: Diagnosis not present

## 2019-09-25 DIAGNOSIS — G2581 Restless legs syndrome: Secondary | ICD-10-CM

## 2019-09-25 DIAGNOSIS — R5383 Other fatigue: Secondary | ICD-10-CM

## 2019-09-25 DIAGNOSIS — Z1231 Encounter for screening mammogram for malignant neoplasm of breast: Secondary | ICD-10-CM

## 2019-09-25 MED ORDER — AMPHETAMINE-DEXTROAMPHETAMINE 20 MG PO TABS: 20.0000 mg | ORAL_TABLET | Freq: Two times a day (BID) | ORAL | 0 refills | Status: DC

## 2019-09-25 MED ORDER — GABAPENTIN 300 MG PO CAPS: ORAL_CAPSULE | ORAL | 3 refills | Status: DC
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
## 2019-09-28 ENCOUNTER — Telehealth: Payer: Self-pay

## 2019-09-28 NOTE — Telephone Encounter (Signed)
Authorization for Amphetamine-Dextroamphetamine 20MG  for 09/26/2019 through 09/26/2022 [facility]

## 2019-10-01 DIAGNOSIS — Z1211 Encounter for screening for malignant neoplasm of colon: Secondary | ICD-10-CM | POA: Insufficient documentation

## 2019-10-01 DIAGNOSIS — Z1231 Encounter for screening mammogram for malignant neoplasm of breast: Secondary | ICD-10-CM | POA: Insufficient documentation

## 2019-10-01 DIAGNOSIS — G2581 Restless legs syndrome: Secondary | ICD-10-CM | POA: Insufficient documentation

## 2019-10-01 DIAGNOSIS — R5383 Other fatigue: Secondary | ICD-10-CM | POA: Insufficient documentation

## 2019-11-03 ENCOUNTER — Ambulatory Visit: Payer: BC Managed Care – PPO | Attending: Internal Medicine

## 2019-11-03 DIAGNOSIS — Z20822 Contact with and (suspected) exposure to covid-19: Secondary | ICD-10-CM

## 2019-11-04 LAB — SARS-COV-2, NAA 2 DAY TAT

## 2019-11-04 LAB — NOVEL CORONAVIRUS, NAA: SARS-CoV-2, NAA: NOT DETECTED

## 2019-12-01 ENCOUNTER — Telehealth: Payer: Self-pay

## 2019-12-01 NOTE — Telephone Encounter (Signed)
Faxed Cologuard paperwork for patient.

## 2019-12-29 ENCOUNTER — Telehealth: Payer: Self-pay

## 2019-12-29 NOTE — Telephone Encounter (Signed)
LMOM fo OV on 9/14

## 2020-01-02 ENCOUNTER — Ambulatory Visit (INDEPENDENT_AMBULATORY_CARE_PROVIDER_SITE_OTHER): Payer: BC Managed Care – PPO | Admitting: Nurse Practitioner

## 2020-01-02 ENCOUNTER — Encounter: Payer: Self-pay | Admitting: Nurse Practitioner

## 2020-01-02 VITALS — BP 132/80 | HR 94 | Temp 97.8°F | Resp 16 | Ht 60.0 in | Wt 179.0 lb

## 2020-01-02 DIAGNOSIS — Z23 Encounter for immunization: Secondary | ICD-10-CM | POA: Diagnosis not present

## 2020-01-02 DIAGNOSIS — Z0001 Encounter for general adult medical examination with abnormal findings: Secondary | ICD-10-CM | POA: Diagnosis not present

## 2020-01-02 DIAGNOSIS — R300 Dysuria: Secondary | ICD-10-CM

## 2020-01-02 DIAGNOSIS — R41840 Attention and concentration deficit: Secondary | ICD-10-CM

## 2020-01-02 DIAGNOSIS — G43011 Migraine without aura, intractable, with status migrainosus: Secondary | ICD-10-CM | POA: Diagnosis not present

## 2020-01-02 MED ORDER — AMPHETAMINE-DEXTROAMPHETAMINE 30 MG PO TABS: 30.0000 mg | ORAL_TABLET | Freq: Two times a day (BID) | ORAL | 0 refills | Status: DC

## 2020-01-02 NOTE — Progress Notes (Signed)
[facility] [place], [place] [address]  Internal MEDICINE  Office Visit Note  Patient Name: Dawn Spencer  [id number]  [id number]  Date of Service: 01/24/2020   Pt is here for routine health maintenance examination  Chief Complaint  Patient presents with  . Annual Exam     The patient is here for health maintenance exam. 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 Is working full time hours and continues to attend school full time to obtain her bachelor's degree in early child education. She restarted taking adderall 30mg  twice daily when needed back in April when she returned to work and school. This medication at this particular dose helps her to stay focused and on track throughout the day and into the evening when she attends classes. She is making good grades. She stays on task and finishes her assignments in a timely manner. She reports no negative side effects from taking adderall and does need to have refills for this today. She had mammogram ordered in 09/2019 and still needs to have this scheduled. She needs to have a flu shot today. She should have routine fasting labs drawn.     Current Medication: Outpatient Encounter Medications as of 01/02/2020  Medication Sig  . Biotin 10 MG CAPS Take 10 mg by mouth daily.  . Calcium Carb-Cholecalciferol (CALCIUM 600/VITAMIN D3 PO) Take 1 tablet by mouth daily.  . cetirizine (ZYRTEC) 10 MG tablet Take 10 mg by mouth daily.  . Cyanocobalamin (B-12) 5000 MCG CAPS Take by mouth.  . cyclobenzaprine (FLEXERIL) 10 MG tablet Take 1 tablet (10 mg total) by mouth at bedtime.  Marland Kitchen FIBER PO Take 1 capsule by mouth daily. Fiber Well 5 g Probiotic  . gabapentin (NEURONTIN) 300 MG capsule Take 1 to 2 capsules po QHS prn  . ibuprofen (ADVIL,MOTRIN) 200 MG tablet Take 600-800 mg by mouth every 6 (six) hours as needed for headache or moderate pain.  . Multiple Vitamins-Minerals (MULTIVITAMIN GUMMIES WOMENS PO) Take 2 each  by mouth daily.  . naproxen sodium (ALEVE) 220 MG tablet Take 440 mg by mouth daily as needed (for pain or headache).  Marland Kitchen omeprazole (PRILOSEC) 20 MG capsule Take 20 mg by mouth daily.  . promethazine (PHENERGAN) 25 MG tablet Take 1 tablet (25 mg total) by mouth every 6 (six) hours as needed for nausea or vomiting.  . rizatriptan (MAXALT) 10 MG tablet Take 1 tablet (10 mg total) by mouth as needed for migraine.  . SUMAtriptan (IMITREX) 6 MG/0.5ML SOLN injection Inject 0.5 mLs (6 mg total) into the skin every 2 (two) hours as needed for migraine or headache.  . amphetamine-dextroamphetamine (ADDERALL) 30 MG tablet Take 1 tablet by mouth 2 (two) times daily.  Marland Kitchen amphetamine-dextroamphetamine (ADDERALL) 30 MG tablet Take 1 tablet by mouth 2 (two) times daily.  Marland Kitchen amphetamine-dextroamphetamine (ADDERALL) 30 MG tablet Take 1 tablet by mouth 2 (two) times daily.  . [DISCONTINUED] amphetamine-dextroamphetamine (ADDERALL) 20 MG tablet Take 1 tablet (20 mg total) by mouth 2 (two) times daily.  . [DISCONTINUED] amphetamine-dextroamphetamine (ADDERALL) 20 MG tablet Take 1 tablet (20 mg total) by mouth 2 (two) times daily.  . [DISCONTINUED] amphetamine-dextroamphetamine (ADDERALL) 20 MG tablet Take 1 tablet (20 mg total) by mouth 2 (two) times daily.   No facility-administered encounter medications on file as of 01/02/2020.    Surgical History: Past Surgical History:  Procedure Laterality Date  . CESAREAN SECTION  90,95, 97   X3  . HYSTEROSCOPY N/A 12/12/2013  Procedure: HYSTEROSCOPY WITH DILATION;  Surgeon: Donnamae Jude, MD;  Location: [facility];  Service: Gynecology;  Laterality: N/A;  . KNEE ARTHROSCOPY Right 08/06/2017   Procedure: ARTHROSCOPY KNEE;  Surgeon: Frederik Pear, MD;  Location: [place];  Service: Orthopedics;  Laterality: Right;  medial minsectomy, condral malsia, femeral condil  . TONSILLECTOMY    . VAGINAL HYSTERECTOMY N/A 04/17/2014   Procedure: HYSTERECTOMY VAGINAL / CYSTOSCOPY;  Surgeon:  Donnamae Jude, MD;  Location: [facility];  Service: Gynecology;  Laterality: N/A;    Medical History: Past Medical History:  Diagnosis Date  . ADD (attention deficit disorder)   . Allergic rhinitis   . Bleeding from nipple in female 08/25/07  . Complication of anesthesia 1997   difficult spinal - "Couldn't get it to take, but have had one since that was fine"  . Diverticulitis   . Generalized headaches    migraines  . History of insomnia   . PONV (postoperative nausea and vomiting)     Family History: Family History  Problem Relation Age of Onset  . Heart disease Paternal Grandfather   . Cancer Maternal Grandmother        BREAST   . Osteoporosis Mother   . Breast cancer Mother   . Cancer Paternal Aunt        breast caner      Review of Systems  Constitutional: Positive for fatigue. Negative for activity change, chills and unexpected weight change.  HENT: Negative for congestion, postnasal drip, rhinorrhea, sneezing and sore throat.   Respiratory: Negative for cough, chest tightness, shortness of breath and wheezing.   Cardiovascular: Negative for chest pain and palpitations.  Gastrointestinal: Negative for abdominal pain, constipation, diarrhea, nausea and vomiting.  Endocrine: Negative for cold intolerance, heat intolerance, polydipsia and polyuria.  Genitourinary: Negative for dysuria, frequency and urgency.  Musculoskeletal: Negative for arthralgias, back pain, joint swelling and neck pain.  Skin: Negative for rash.  Allergic/Immunologic: Negative for environmental allergies.  Neurological: Positive for headaches. Negative for dizziness, tremors and numbness.  Hematological: Negative for adenopathy. Does not bruise/bleed easily.  Psychiatric/Behavioral: Positive for decreased concentration. Negative for behavioral problems (Depression), sleep disturbance and suicidal ideas. The patient is nervous/anxious.      Today's Vitals   01/02/20 1536  BP: 132/80  Pulse: 94   Resp: 16  Temp: 97.8 F (36.6 C)  SpO2: 97%  Weight: 179 lb (81.2 kg)  Height: 5' (1.524 m)   Body mass index is 34.96 kg/m.  Physical Exam Vitals and nursing note reviewed.  Constitutional:      General: She is not in acute distress.    Appearance: Normal appearance. She is well-developed. She is not diaphoretic.  HENT:     Head: Normocephalic and atraumatic.     Nose: Nose normal.     Mouth/Throat:     Pharynx: No oropharyngeal exudate.  Eyes:     Pupils: Pupils are equal, round, and reactive to light.  Neck:     Thyroid: No thyromegaly.     Vascular: No carotid bruit or JVD.     Trachea: No tracheal deviation.  Cardiovascular:     Rate and Rhythm: Normal rate and regular rhythm.     Pulses: Normal pulses.     Heart sounds: Normal heart sounds. No murmur heard.  No friction rub. No gallop.   Pulmonary:     Effort: Pulmonary effort is normal. No respiratory distress.     Breath sounds: Normal breath sounds. No wheezing or rales.  Chest:     Chest wall: No tenderness.     Breasts:        Right: Normal. No swelling, bleeding, inverted nipple, mass, nipple discharge, skin change or tenderness.        Left: Normal. No swelling, bleeding, inverted nipple, mass, nipple discharge, skin change or tenderness.  Abdominal:     General: Bowel sounds are normal.     Palpations: Abdomen is soft.     Tenderness: There is no abdominal tenderness.  Musculoskeletal:        General: Normal range of motion.     Cervical back: Normal range of motion and neck supple.  Lymphadenopathy:     Cervical: No cervical adenopathy.     Upper Body:     Right upper body: No axillary adenopathy.     Left upper body: No axillary adenopathy.  Skin:    General: Skin is warm and dry.  Neurological:     Mental Status: She is alert and oriented to person, place, and time.     Cranial Nerves: No cranial nerve deficit.  Psychiatric:        Attention and Perception: Attention and perception normal.         Mood and Affect: Affect normal. Mood is anxious.        Speech: Speech normal.        Behavior: Behavior normal.        Thought Content: Thought content normal.        Cognition and Memory: Cognition normal.        Judgment: Judgment normal.      LABS: Recent Results (from the past 2160 hour(s))  Novel Coronavirus, NAA ([facility])     Status: None   Collection Time: 11/03/19  2:33 PM   Specimen: Oropharyngeal(OP) collection in vial transport medium   Oropharyngea  Testing  Result Value Ref Range   SARS-CoV-2, NAA Not Detected Not Detected    Comment: This nucleic acid amplification test was developed and its performance characteristics determined by [employer]. Nucleic acid amplification tests include RT-PCR and TMA. This test has not been FDA cleared or approved. This test has been authorized by FDA under an Emergency Use Authorization (EUA). This test is only authorized for the duration of time the declaration that circumstances exist justifying the authorization of the emergency use of in vitro diagnostic tests for detection of SARS-CoV-2 virus and/or diagnosis of COVID-19 infection under section 564(b)(1) of the Act, 21 U.S.C. 500XFG-1(W) (1), unless the authorization is terminated or revoked sooner. When diagnostic testing is negative, the possibility of a false negative result should be considered in the context of a patient's recent exposures and the presence of clinical signs and symptoms consistent with COVID-19. An individual without symptoms of COVID-19 and who is not shedding SARS-CoV-2 virus wo uld expect to have a negative (not detected) result in this assay.   SARS-COV-2, NAA 2 DAY TAT     Status: None   Collection Time: 11/03/19  2:33 PM   Oropharyngea  Testing  Result Value Ref Range   SARS-CoV-2, NAA 2 DAY TAT Performed   UA/M w/rflx Culture, Routine     Status: Abnormal   Collection Time: 01/02/20  4:24 PM   Specimen: Urine   Urine  Result  Value Ref Range   Specific Gravity, UA      <=1.005 (A) 1.005 - 1.030   pH, UA 7.5 5.0 - 7.5   Color, UA Yellow Yellow   Appearance  Ur Clear Clear   Leukocytes,UA Negative Negative   Protein,UA Negative Negative/Trace   Glucose, UA Negative Negative   Ketones, UA Negative Negative   RBC, UA Negative Negative   Bilirubin, UA Negative Negative   Urobilinogen, Ur 0.2 0.2 - 1.0 mg/dL   Nitrite, UA Negative Negative   Microscopic Examination Comment     Comment: Microscopic follows if indicated.   Microscopic Examination See below:     Comment: Microscopic was indicated and was performed.   Urinalysis Reflex Comment     Comment: This specimen will not reflex to a Urine Culture.  Microscopic Examination     Status: None   Collection Time: 01/02/20  4:24 PM   Urine  Result Value Ref Range   WBC, UA None seen 0 - 5 /hpf   RBC None seen 0 - 2 /hpf   Epithelial Cells (non renal) None seen 0 - 10 /hpf   Casts None seen None seen /lpf   Bacteria, UA Few None seen/Few    Assessment/Plan: 1. Encounter for general adult medical examination with abnormal findings Annual health maintenance exam today. Lab slip given to have routine, fasting labs done   2. Intractable migraine without aura and with status migrainosus Generally stable. Continue to use abortive therapy as needed and as prescribed   3. Attention and concentration deficit May continue with current dose adderall twice daily when needed. Three 30 day prescriptions sent to her pharmacy. Dates are 01/02/2020, 01/30/2020, and 02/28/2020 - amphetamine-dextroamphetamine (ADDERALL) 30 MG tablet; Take 1 tablet by mouth 2 (two) times daily.  Dispense: 60 tablet; Refill: 0 - amphetamine-dextroamphetamine (ADDERALL) 30 MG tablet; Take 1 tablet by mouth 2 (two) times daily.  Dispense: 60 tablet; Refill: 0 - amphetamine-dextroamphetamine (ADDERALL) 30 MG tablet; Take 1 tablet by mouth 2 (two) times daily.  Dispense: 60 tablet; Refill: 0  4.  Needs flu shot Flu vaccine administered today.  - Flu Vaccine MDCK QUAD PF  5. Dysuria - UA/M w/rflx Culture, Routine  General Counseling: Everlena verbalizes understanding of the findings of todays visit and agrees with plan of treatment. I have discussed any further diagnostic evaluation that may be needed or ordered today. We also reviewed her medications today. she has been encouraged to call the office with any questions or concerns that should arise related to todays visit.    Counseling:  Refilled Controlled medications today. Reviewed risks and possible side effects associated with taking Stimulants. Combination of these drugs with other psychotropic medications could cause dizziness and drowsiness. Pt needs to Monitor symptoms and exercise caution in driving and operating heavy machinery to avoid damages to oneself, to others and to the surroundings. Patient verbalized understanding in this matter. Dependence and abuse for these drugs will be monitored closely. A Controlled substance policy and procedure is on file which allows [facility] to order a urine drug screen test at any visit. Patient understands and agrees with the plan..  This patient was seen by Leretha Pol FNP Collaboration with Dr Lavera Guise as a part of collaborative care agreement  Orders Placed This Encounter  Procedures  . Microscopic Examination  . Flu Vaccine MDCK QUAD PF  . UA/M w/rflx Culture, Routine    Meds ordered this encounter  Medications  . amphetamine-dextroamphetamine (ADDERALL) 30 MG tablet    Sig: Take 1 tablet by mouth 2 (two) times daily.    Dispense:  60 tablet    Refill:  0    Order Specific Question:  Supervising Provider    Answer:   Lavera Guise [[id number]]  . amphetamine-dextroamphetamine (ADDERALL) 30 MG tablet    Sig: Take 1 tablet by mouth 2 (two) times daily.    Dispense:  60 tablet    Refill:  0    Fill after 01/30/2020    Order Specific Question:   Supervising  Provider    Answer:   Lavera Guise [[id number]]  . amphetamine-dextroamphetamine (ADDERALL) 30 MG tablet    Sig: Take 1 tablet by mouth 2 (two) times daily.    Dispense:  60 tablet    Refill:  0    Order Specific Question:   Supervising Provider    Answer:   Lavera Guise [[id number]]    Total time spent: 12 Minutes  Time spent includes review of chart, medications, test results, and follow up plan with the patient.     Lavera Guise, MD  Internal Medicine

## 2020-01-03 LAB — UA/M W/RFLX CULTURE, ROUTINE
Bilirubin, UA: NEGATIVE
Glucose, UA: NEGATIVE
Ketones, UA: NEGATIVE
Leukocytes,UA: NEGATIVE
Nitrite, UA: NEGATIVE
Protein,UA: NEGATIVE
RBC, UA: NEGATIVE
Specific Gravity, UA: <=1.005 — AB (ref 1.005–1.030)
Urobilinogen, Ur: 0.2 mg/dL (ref 0.2–1.0)
pH, UA: 7.5 (ref 5.0–7.5)

## 2020-01-03 LAB — MICROSCOPIC EXAMINATION
Casts: NONE SEEN /lpf
Epithelial Cells (non renal): NONE SEEN /hpf (ref 0–10)
RBC, Urine: NONE SEEN /hpf (ref 0–2)
WBC, UA: NONE SEEN /hpf (ref 0–5)

## 2020-01-09 ENCOUNTER — Telehealth: Payer: Self-pay

## 2020-01-09 NOTE — Telephone Encounter (Signed)
Authorization approved for Amphetamine Dextroamphetamine 30mg  tablets from 01/04/2020 through 01/04/2023 [facility]

## 2020-01-24 ENCOUNTER — Encounter: Payer: Self-pay | Admitting: Nurse Practitioner

## 2020-01-24 DIAGNOSIS — R300 Dysuria: Secondary | ICD-10-CM | POA: Insufficient documentation

## 2020-01-24 DIAGNOSIS — Z0001 Encounter for general adult medical examination with abnormal findings: Secondary | ICD-10-CM | POA: Insufficient documentation

## 2020-01-24 DIAGNOSIS — Z23 Encounter for immunization: Secondary | ICD-10-CM | POA: Insufficient documentation

## 2020-03-26 ENCOUNTER — Ambulatory Visit (INDEPENDENT_AMBULATORY_CARE_PROVIDER_SITE_OTHER): Payer: BC Managed Care – PPO | Admitting: Nurse Practitioner

## 2020-03-26 ENCOUNTER — Other Ambulatory Visit: Payer: Self-pay

## 2020-03-26 ENCOUNTER — Encounter: Payer: Self-pay | Admitting: Nurse Practitioner

## 2020-03-26 VITALS — BP 128/80 | HR 90 | Temp 98.1°F | Resp 16 | Ht 60.0 in | Wt 180.0 lb

## 2020-03-26 DIAGNOSIS — R5383 Other fatigue: Secondary | ICD-10-CM

## 2020-03-26 DIAGNOSIS — R635 Abnormal weight gain: Secondary | ICD-10-CM | POA: Diagnosis not present

## 2020-03-26 DIAGNOSIS — R41840 Attention and concentration deficit: Secondary | ICD-10-CM

## 2020-03-26 DIAGNOSIS — Z79899 Other long term (current) drug therapy: Secondary | ICD-10-CM

## 2020-03-26 LAB — POCT URINE DRUG SCREEN
Methylenedioxyamphetamine: NOT DETECTED
POC Amphetamine UR: POSITIVE — AB
POC BENZODIAZEPINES UR: NOT DETECTED
POC Barbiturate UR: NOT DETECTED
POC Cocaine UR: NOT DETECTED
POC Ecstasy UR: NOT DETECTED
POC Marijuana UR: NOT DETECTED
POC Methadone UR: NOT DETECTED
POC Methamphetamine UR: NOT DETECTED
POC Opiate Ur: NOT DETECTED
POC Oxycodone UR: NOT DETECTED
POC PHENCYCLIDINE UR: NOT DETECTED
POC TRICYCLICS UR: POSITIVE — AB

## 2020-03-26 MED ORDER — LISDEXAMFETAMINE DIMESYLATE 50 MG PO CAPS: 50.0000 mg | ORAL_CAPSULE | Freq: Every day | ORAL | 0 refills | Status: DC

## 2020-03-26 NOTE — Progress Notes (Signed)
[facility] [place], [place] [address]  Internal MEDICINE  Office Visit Note  Patient Name: Dawn Spencer  [id number]  [id number]  Date of Service: 04/27/2020  Chief Complaint  Patient presents with  . Follow-up  . Fatigue  . controlled substance form    reviewed with PT    The patient is here for follow up. 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 She is currently taking adderall 30mg .  She was given a Financial planner for adult onset ADD. She scored 4/6 indicating that she does have adult onset ADD. We have discussed new policy for controlled substances. We will do trial for vyvanse daily as needed.  She is reporting increased fatigue from usual.  She reports difficulty losing weight.       Current Medication: Outpatient Encounter Medications as of 03/26/2020  Medication Sig  . Biotin 10 MG CAPS Take 10 mg by mouth daily.  . Calcium Carb-Cholecalciferol (CALCIUM 600/VITAMIN D3 PO) Take 1 tablet by mouth daily.  . cetirizine (ZYRTEC) 10 MG tablet Take 10 mg by mouth daily.  . Cyanocobalamin (B-12) 5000 MCG CAPS Take by mouth.  . cyclobenzaprine (FLEXERIL) 10 MG tablet Take 1 tablet (10 mg total) by mouth at bedtime.  Marland Kitchen FIBER PO Take 1 capsule by mouth daily. Fiber Well 5 g Probiotic  . gabapentin (NEURONTIN) 300 MG capsule Take 1 to 2 capsules po QHS prn  . ibuprofen (ADVIL,MOTRIN) 200 MG tablet Take 600-800 mg by mouth every 6 (six) hours as needed for headache or moderate pain.  . Multiple Vitamins-Minerals (MULTIVITAMIN GUMMIES WOMENS PO) Take 2 each by mouth daily.  . naproxen sodium (ALEVE) 220 MG tablet Take 440 mg by mouth daily as needed (for pain or headache).  Marland Kitchen omeprazole (PRILOSEC) 20 MG capsule Take 20 mg by mouth daily.  . promethazine (PHENERGAN) 25 MG tablet Take 1 tablet (25 mg total) by mouth every 6 (six) hours as needed for nausea or vomiting.  .  rizatriptan (MAXALT) 10 MG tablet Take 1 tablet (10 mg total) by mouth as needed for migraine.  . SUMAtriptan (IMITREX) 6 MG/0.5ML SOLN injection Inject 0.5 mLs (6 mg total) into the skin every 2 (two) hours as needed for migraine or headache.  . [DISCONTINUED] amphetamine-dextroamphetamine (ADDERALL) 30 MG tablet Take 1 tablet by mouth 2 (two) times daily.  . [DISCONTINUED] amphetamine-dextroamphetamine (ADDERALL) 30 MG tablet Take 1 tablet by mouth 2 (two) times daily.  . [DISCONTINUED] amphetamine-dextroamphetamine (ADDERALL) 30 MG tablet Take 1 tablet by mouth 2 (two) times daily.  . [DISCONTINUED] lisdexamfetamine (VYVANSE) 50 MG capsule Take 1 capsule (50 mg total) by mouth daily.   No facility-administered encounter medications on file as of 03/26/2020.    Surgical History: Past Surgical History:  Procedure Laterality Date  . CESAREAN SECTION  90,95, 97   X3  . HYSTEROSCOPY N/A 12/12/2013   Procedure: HYSTEROSCOPY WITH DILATION;  Surgeon: Donnamae Jude, MD;  Location: [facility];  Service: Gynecology;  Laterality: N/A;  . KNEE ARTHROSCOPY Right 08/06/2017   Procedure: ARTHROSCOPY KNEE;  Surgeon: Frederik Pear, MD;  Location: [place];  Service: Orthopedics;  Laterality: Right;  medial minsectomy, condral malsia, femeral condil  . TONSILLECTOMY    . VAGINAL HYSTERECTOMY N/A 04/17/2014   Procedure: HYSTERECTOMY VAGINAL / CYSTOSCOPY;  Surgeon: Donnamae Jude, MD;  Location: [facility];  Service: Gynecology;  Laterality: N/A;    Medical History: Past  Medical History:  Diagnosis Date  . ADD (attention deficit disorder)   . Allergic rhinitis   . Bleeding from nipple in female 08/25/07  . Complication of anesthesia 1997   difficult spinal - "Couldn't get it to take, but have had one since that was fine"  . Diverticulitis   . Generalized headaches    migraines  . History of insomnia   . PONV (postoperative nausea and vomiting)     Family History: Family History  Problem Relation Age of Onset   . Heart disease Paternal Grandfather   . Cancer Maternal Grandmother        BREAST   . Osteoporosis Mother   . Breast cancer Mother   . Cancer Paternal Aunt        breast caner    Social History   Socioeconomic History  . Marital status: Married    Spouse name: Not on file  . Number of children: Not on file  . Years of education: Not on file  . Highest education level: Not on file  Occupational History  . Not on file  Tobacco Use  . Smoking status: Never Smoker  . Smokeless tobacco: Never Used  Vaping Use  . Vaping Use: Never used  Substance and Sexual Activity  . Alcohol use: Not Currently    Alcohol/week: 0.0 standard drinks    Comment: occasional  . Drug use: No  . Sexual activity: Yes    Partners: Male    Birth control/protection: Surgical  Other Topics Concern  . Not on file  Social History Narrative  . Not on file   Social Determinants of Health   Financial Resource Strain: Not on file  Food Insecurity: Not on file  Transportation Needs: Not on file  Physical Activity: Not on file  Stress: Not on file  Social Connections: Not on file  Intimate Partner Violence: Not on file      Review of Systems  Constitutional: Positive for fatigue. Negative for activity change, chills and unexpected weight change.  HENT: Negative for congestion, postnasal drip, rhinorrhea, sneezing and sore throat.   Respiratory: Negative for cough, chest tightness, shortness of breath and wheezing.   Cardiovascular: Negative for chest pain and palpitations.  Gastrointestinal: Negative for abdominal pain, constipation, diarrhea, nausea and vomiting.  Endocrine: Negative for cold intolerance, heat intolerance, polydipsia and polyuria.  Genitourinary: Negative for dysuria, frequency and urgency.  Musculoskeletal: Negative for arthralgias, back pain, joint swelling and neck pain.  Skin: Negative for rash.  Allergic/Immunologic: Negative for environmental allergies.  Neurological:  Positive for headaches. Negative for dizziness, tremors and numbness.  Hematological: Negative for adenopathy. Does not bruise/bleed easily.  Psychiatric/Behavioral: Positive for decreased concentration. Negative for behavioral problems (Depression), sleep disturbance and suicidal ideas. The patient is nervous/anxious.        The patient scored 4/6 on Conner's self-assessment for adult add.     Today's Vitals   03/26/20 1553  BP: 128/80  Pulse: 90  Resp: 16  Temp: 98.1 F (36.7 C)  SpO2: 95%  Weight: 180 lb (81.6 kg)  Height: 5' (1.524 m)   Body mass index is 35.15 kg/m.  Physical Exam Vitals and nursing note reviewed.  Constitutional:      General: She is not in acute distress.    Appearance: Normal appearance. She is well-developed and well-nourished. She is not diaphoretic.  HENT:     Head: Normocephalic and atraumatic.     Mouth/Throat:     Mouth: Oropharynx is clear and  moist.     Pharynx: No oropharyngeal exudate.  Eyes:     Extraocular Movements: EOM normal.     Pupils: Pupils are equal, round, and reactive to light.  Neck:     Thyroid: No thyromegaly.     Vascular: No JVD.     Trachea: No tracheal deviation.  Cardiovascular:     Rate and Rhythm: Normal rate and regular rhythm.     Heart sounds: Normal heart sounds. No murmur heard. No friction rub. No gallop.   Pulmonary:     Effort: Pulmonary effort is normal. No respiratory distress.     Breath sounds: Normal breath sounds. No wheezing or rales.  Chest:     Chest wall: No tenderness.  Abdominal:     Palpations: Abdomen is soft.  Musculoskeletal:        General: Normal range of motion.     Cervical back: Normal range of motion and neck supple.  Lymphadenopathy:     Cervical: No cervical adenopathy.  Skin:    General: Skin is warm and dry.     Capillary Refill: Capillary refill takes less than 2 seconds.  Neurological:     Mental Status: She is alert and oriented to person, place, and time.      Cranial Nerves: No cranial nerve deficit.  Psychiatric:        Mood and Affect: Mood and affect and mood normal.        Behavior: Behavior normal.        Thought Content: Thought content normal.        Judgment: Judgment normal.    Assessment/Plan:  1. Other fatigue Check labs including full anemia and thyroid panels.  2. Abnormal weight gain Check labs including full anemia and thyroid panels.  3. Attention and concentration deficit Patient scoring 4/6 on conner's self-assessment for adult onset ADD. Trial vyvanse 50mg  daily as needed for focus and concentration. She plans to take medication holidays on weekends and days off from work and school.   4. Encounter for long-term (current) use of high-risk medication - POCT Urine Drug Screen positive for AMP and TCA   General Counseling: Synthia verbalizes understanding of the findings of todays visit and agrees with plan of treatment. I have discussed any further diagnostic evaluation that may be needed or ordered today. We also reviewed her medications today. she has been encouraged to call the office with any questions or concerns that should arise related to todays visit.  This patient was seen by Leretha Pol FNP Collaboration with Dr Lavera Guise as a part of collaborative care agreement  Orders Placed This Encounter  Procedures  . POCT Urine Drug Screen    Meds ordered this encounter  Medications  . DISCONTD: lisdexamfetamine (VYVANSE) 50 MG capsule    Sig: Take 1 capsule (50 mg total) by mouth daily.    Dispense:  30 capsule    Refill:  0    Patient will have manufacturer copay card    Order Specific Question:   Supervising Provider    Answer:   Lavera Guise [[id number]]    Total time spent: 30 Minutes   Time spent includes review of chart, medications, test results, and follow up plan with the patient.      Dr Lavera Guise Internal medicine

## 2020-03-27 ENCOUNTER — Other Ambulatory Visit: Payer: Self-pay | Admitting: Nurse Practitioner

## 2020-03-28 LAB — CBC
Hematocrit: 39.4 % (ref 34.0–46.6)
Hemoglobin: 13.1 g/dL (ref 11.1–15.9)
MCH: 30 pg (ref 26.6–33.0)
MCHC: 33.2 g/dL (ref 31.5–35.7)
MCV: 90 fL (ref 79–97)
Platelets: 391 10*3/uL (ref 150–450)
RBC: 4.37 x10E6/uL (ref 3.77–5.28)
RDW: 13.1 % (ref 11.7–15.4)
WBC: 6.6 10*3/uL (ref 3.4–10.8)

## 2020-03-28 LAB — COMPREHENSIVE METABOLIC PANEL
ALT: 13 IU/L (ref 0–32)
AST: 23 IU/L (ref 0–40)
Albumin/Globulin Ratio: 1.8 (ref 1.2–2.2)
Albumin: 4.7 g/dL (ref 3.8–4.9)
Alkaline Phosphatase: 103 IU/L (ref 44–121)
BUN/Creatinine Ratio: 22 (ref 9–23)
BUN: 16 mg/dL (ref 6–24)
Bilirubin Total: 0.2 mg/dL (ref 0.0–1.2)
CO2: 22 mmol/L (ref 20–29)
Calcium: 10.1 mg/dL (ref 8.7–10.2)
Chloride: 103 mmol/L (ref 96–106)
Creatinine, Ser: 0.74 mg/dL (ref 0.57–1.00)
GFR calc Af Amer: 106 mL/min/{1.73_m2} (ref >59)
GFR calc non Af Amer: 92 mL/min/{1.73_m2} (ref >59)
Globulin, Total: 2.6 g/dL (ref 1.5–4.5)
Glucose: 82 mg/dL (ref 65–99)
Potassium: 5.2 mmol/L (ref 3.5–5.2)
Sodium: 142 mmol/L (ref 134–144)
Total Protein: 7.3 g/dL (ref 6.0–8.5)

## 2020-03-28 LAB — IRON AND TIBC
Iron Saturation: 18 % (ref 15–55)
Iron: 71 ug/dL (ref 27–159)
Total Iron Binding Capacity: 403 ug/dL (ref 250–450)
UIBC: 332 ug/dL (ref 131–425)

## 2020-03-28 LAB — B12 AND FOLATE PANEL
Folate: >20 ng/mL (ref >3.0)
Vitamin B-12: 657 pg/mL (ref 232–1245)

## 2020-03-28 LAB — FERRITIN: Ferritin: 25 ng/mL (ref 15–150)

## 2020-03-28 LAB — T4, FREE: Free T4: 1.15 ng/dL (ref 0.82–1.77)

## 2020-03-28 LAB — VITAMIN D 25 HYDROXY (VIT D DEFICIENCY, FRACTURES): Vit D, 25-Hydroxy: 21 ng/mL — ABNORMAL LOW (ref 30.0–100.0)

## 2020-03-28 LAB — TSH: TSH: 1.78 u[IU]/mL (ref 0.450–4.500)

## 2020-03-28 LAB — HGB A1C W/O EAG: Hgb A1c MFr Bld: 5.3 % (ref 4.8–5.6)

## 2020-03-29 ENCOUNTER — Encounter: Payer: Self-pay | Admitting: Nurse Practitioner

## 2020-04-01 ENCOUNTER — Encounter: Payer: Self-pay | Admitting: Nurse Practitioner

## 2020-04-15 ENCOUNTER — Telehealth: Payer: Self-pay

## 2020-04-15 NOTE — Telephone Encounter (Signed)
Vyvanse 50MG  approved 04-11-20 to 04-12-23, [facility] to inform pt.

## 2020-04-22 ENCOUNTER — Ambulatory Visit (INDEPENDENT_AMBULATORY_CARE_PROVIDER_SITE_OTHER): Payer: BC Managed Care – PPO | Admitting: Nurse Practitioner

## 2020-04-22 ENCOUNTER — Encounter: Payer: Self-pay | Admitting: Nurse Practitioner

## 2020-04-22 ENCOUNTER — Other Ambulatory Visit: Payer: Self-pay

## 2020-04-22 VITALS — BP 120/82 | HR 78 | Temp 97.4°F | Resp 16 | Ht 60.0 in | Wt 186.6 lb

## 2020-04-22 DIAGNOSIS — R5383 Other fatigue: Secondary | ICD-10-CM

## 2020-04-22 DIAGNOSIS — E559 Vitamin D deficiency, unspecified: Secondary | ICD-10-CM

## 2020-04-22 DIAGNOSIS — R41840 Attention and concentration deficit: Secondary | ICD-10-CM

## 2020-04-22 MED ORDER — LISDEXAMFETAMINE DIMESYLATE 50 MG PO CAPS: 50.0000 mg | ORAL_CAPSULE | Freq: Every day | ORAL | 0 refills | Status: DC

## 2020-04-22 MED ORDER — ERGOCALCIFEROL 1.25 MG (50000 UT) PO CAPS: 50000.0000 [IU] | ORAL_CAPSULE | ORAL | 5 refills | Status: AC

## 2020-04-22 NOTE — Progress Notes (Signed)
[facility] [place], [place] [address]  Internal MEDICINE  Office Visit Note  Patient Name: Dawn Spencer  [id number]  [id number]  Date of Service: 05/10/2020  Chief Complaint  Patient presents with  . Follow-up    Change to meds  . ADD  . Quality Metric Gaps    mammogram    The patient is here for follow up visit.  -continues to have some fatigue  -labs done prior to this visit showed moderate vitamin d deficiency. Other labs were normal.  -Prior visit, she scored 4/6 on Conner's self-assessment for adult ADD. She has done well with trial of vyvanse. No negative side effects associated with taking this new meddicaiton.       Current Medication: Outpatient Encounter Medications as of 04/22/2020  Medication Sig  . Biotin 10 MG CAPS Take 10 mg by mouth daily.  . Calcium Carb-Cholecalciferol (CALCIUM 600/VITAMIN D3 PO) Take 1 tablet by mouth daily.  . cetirizine (ZYRTEC) 10 MG tablet Take 10 mg by mouth daily.  . Cyanocobalamin (B-12) 5000 MCG CAPS Take by mouth.  . cyclobenzaprine (FLEXERIL) 10 MG tablet Take 1 tablet (10 mg total) by mouth at bedtime.  . ergocalciferol (DRISDOL) 1.25 MG (50000 UT) capsule Take 1 capsule (50,000 Units total) by mouth once a week.  Marland Kitchen FIBER PO Take 1 capsule by mouth daily. Fiber Well 5 g Probiotic  . gabapentin (NEURONTIN) 300 MG capsule Take 1 to 2 capsules po QHS prn  . ibuprofen (ADVIL,MOTRIN) 200 MG tablet Take 600-800 mg by mouth every 6 (six) hours as needed for headache or moderate pain.  . Multiple Vitamins-Minerals (MULTIVITAMIN GUMMIES WOMENS PO) Take 2 each by mouth daily.  . naproxen sodium (ALEVE) 220 MG tablet Take 440 mg by mouth daily as needed (for pain or headache).  Marland Kitchen omeprazole (PRILOSEC) 20 MG capsule Take 20 mg by mouth daily.  . promethazine (PHENERGAN) 25 MG tablet Take 1 tablet (25 mg total) by mouth every 6 (six) hours as needed for nausea or vomiting.  . rizatriptan (MAXALT) 10 MG tablet  Take 1 tablet (10 mg total) by mouth as needed for migraine.  . SUMAtriptan (IMITREX) 6 MG/0.5ML SOLN injection Inject 0.5 mLs (6 mg total) into the skin every 2 (two) hours as needed for migraine or headache.  . [DISCONTINUED] lisdexamfetamine (VYVANSE) 50 MG capsule Take 1 capsule (50 mg total) by mouth daily.  Marland Kitchen lisdexamfetamine (VYVANSE) 50 MG capsule Take 1 capsule (50 mg total) by mouth daily.   No facility-administered encounter medications on file as of 04/22/2020.    Surgical History: Past Surgical History:  Procedure Laterality Date  . CESAREAN SECTION  90,95, 97   X3  . HYSTEROSCOPY N/A 12/12/2013   Procedure: HYSTEROSCOPY WITH DILATION;  Surgeon: Donnamae Jude, MD;  Location: [facility];  Service: Gynecology;  Laterality: N/A;  . KNEE ARTHROSCOPY Right 08/06/2017   Procedure: ARTHROSCOPY KNEE;  Surgeon: Frederik Pear, MD;  Location: [facility];  Service: Orthopedics;  Laterality: Right;  medial minsectomy, condral malsia, femeral condil  . TONSILLECTOMY    . VAGINAL HYSTERECTOMY N/A 04/17/2014   Procedure: HYSTERECTOMY VAGINAL / CYSTOSCOPY;  Surgeon: Donnamae Jude, MD;  Location: [facility];  Service: Gynecology;  Laterality: N/A;    Medical History: Past Medical History:  Diagnosis Date  . ADD (attention deficit disorder)   . Allergic rhinitis   . Bleeding from nipple in female 08/25/07  . Complication of anesthesia 1997   difficult spinal - "  Couldn't get it to take, but have had one since that was fine"  . Diverticulitis   . Generalized headaches    migraines  . History of insomnia   . PONV (postoperative nausea and vomiting)     Family History: Family History  Problem Relation Age of Onset  . Heart disease Paternal Grandfather   . Cancer Maternal Grandmother        BREAST   . Osteoporosis Mother   . Breast cancer Mother   . Cancer Paternal Aunt        breast caner    Social History   Socioeconomic History  . Marital status: Married    Spouse name: Not on file  .  Number of children: Not on file  . Years of education: Not on file  . Highest education level: Not on file  Occupational History  . Not on file  Tobacco Use  . Smoking status: Never Smoker  . Smokeless tobacco: Never Used  Vaping Use  . Vaping Use: Never used  Substance and Sexual Activity  . Alcohol use: Not Currently    Alcohol/week: 0.0 standard drinks    Comment: occasional  . Drug use: No  . Sexual activity: Yes    Partners: Male    Birth control/protection: Surgical  Other Topics Concern  . Not on file  Social History Narrative  . Not on file   Social Determinants of Health   Financial Resource Strain: Not on file  Food Insecurity: Not on file  Transportation Needs: Not on file  Physical Activity: Not on file  Stress: Not on file  Social Connections: Not on file  Intimate Partner Violence: Not on file      Review of Systems  Constitutional: Positive for fatigue. Negative for activity change, chills and unexpected weight change.  HENT: Negative for congestion, postnasal drip, rhinorrhea, sneezing and sore throat.   Respiratory: Negative for cough, chest tightness, shortness of breath and wheezing.   Cardiovascular: Negative for chest pain and palpitations.  Gastrointestinal: Negative for abdominal pain, constipation, diarrhea, nausea and vomiting.  Endocrine: Negative for cold intolerance, heat intolerance, polydipsia and polyuria.  Genitourinary: Negative for urgency.  Musculoskeletal: Negative for arthralgias, back pain, joint swelling and neck pain.  Skin: Negative for rash.  Allergic/Immunologic: Negative for environmental allergies.  Neurological: Positive for headaches. Negative for dizziness, tremors and numbness.  Hematological: Negative for adenopathy. Does not bruise/bleed easily.  Psychiatric/Behavioral: Positive for decreased concentration. Negative for behavioral problems (Depression), sleep disturbance and suicidal ideas. The patient is  nervous/anxious.        The patient scored 4/6 on Conner's self-assessment for adult add. Has done well on vyvanse 50mg  daily.    Today's Vitals   04/22/20 1039  BP: 120/82  Pulse: 78  Resp: 16  Temp: (!) 97.4 F (36.3 C)  SpO2: 98%  Weight: 186 lb 9.6 oz (84.6 kg)  Height: 5' (1.524 m)   Body mass index is 36.44 kg/m.  Physical Exam Vitals and nursing note reviewed.  Constitutional:      General: She is not in acute distress.    Appearance: Normal appearance. She is well-developed. She is not diaphoretic.  HENT:     Head: Normocephalic and atraumatic.     Mouth/Throat:     Pharynx: No oropharyngeal exudate.  Eyes:     Pupils: Pupils are equal, round, and reactive to light.  Neck:     Thyroid: No thyromegaly.     Vascular: No JVD.  Trachea: No tracheal deviation.  Cardiovascular:     Rate and Rhythm: Normal rate and regular rhythm.     Heart sounds: Normal heart sounds. No murmur heard. No friction rub. No gallop.   Pulmonary:     Effort: Pulmonary effort is normal. No respiratory distress.     Breath sounds: Normal breath sounds. No wheezing or rales.  Chest:     Chest wall: No tenderness.  Abdominal:     Palpations: Abdomen is soft.  Musculoskeletal:        General: Normal range of motion.     Cervical back: Normal range of motion and neck supple.  Lymphadenopathy:     Cervical: No cervical adenopathy.  Skin:    General: Skin is warm and dry.     Capillary Refill: Capillary refill takes less than 2 seconds.  Neurological:     Mental Status: She is alert and oriented to person, place, and time.     Cranial Nerves: No cranial nerve deficit.  Psychiatric:        Mood and Affect: Mood normal.        Behavior: Behavior normal.        Thought Content: Thought content normal.        Judgment: Judgment normal.     Assessment/Plan: 1. Other fatigue Reviewed labs. Had normal thyroid and anemia panels. Will continue to monitor  2. Vitamin D deficiency Add  drisdol weekly for next several months.  - ergocalciferol (DRISDOL) 1.25 MG (50000 UT) capsule; Take 1 capsule (50,000 Units total) by mouth once a week.  Dispense: 4 capsule; Refill: 5  3. Attention and concentration deficit Done well with vyvanse 50mg  daily. Single, post-dated prescription sent to her pharmacy. Date is 05/21/2020.  - lisdexamfetamine (VYVANSE) 50 MG capsule; Take 1 capsule (50 mg total) by mouth daily.  Dispense: 30 capsule; Refill: 0  General Counseling: Elner verbalizes understanding of the findings of todays visit and agrees with plan of treatment. I have discussed any further diagnostic evaluation that may be needed or ordered today. We also reviewed her medications today. she has been encouraged to call the office with any questions or concerns that should arise related to todays visit.   This patient was seen by [place] with Dr Lavera Guise as a part of collaborative care agreement  Meds ordered this encounter  Medications  . ergocalciferol (DRISDOL) 1.25 MG (50000 UT) capsule    Sig: Take 1 capsule (50,000 Units total) by mouth once a week.    Dispense:  4 capsule    Refill:  5    Order Specific Question:   Supervising Provider    Answer:   Lavera Guise [id number]  . lisdexamfetamine (VYVANSE) 50 MG capsule    Sig: Take 1 capsule (50 mg total) by mouth daily.    Dispense:  30 capsule    Refill:  0    Fill after 05/21/2020    Order Specific Question:   Supervising Provider    Answer:   Lavera Guise [id number]    Total time spent: 25 Minutes   Time spent includes review of chart, medications, test results, and follow up plan with the patient.      Dr Lavera Guise Internal medicine

## 2020-04-23 ENCOUNTER — Ambulatory Visit: Payer: BC Managed Care – PPO | Admitting: Nurse Practitioner

## 2020-04-27 DIAGNOSIS — Z79899 Other long term (current) drug therapy: Secondary | ICD-10-CM | POA: Insufficient documentation

## 2020-04-27 DIAGNOSIS — R635 Abnormal weight gain: Secondary | ICD-10-CM | POA: Insufficient documentation

## 2020-05-10 DIAGNOSIS — E559 Vitamin D deficiency, unspecified: Secondary | ICD-10-CM | POA: Insufficient documentation

## 2020-06-21 ENCOUNTER — Ambulatory Visit: Payer: BC Managed Care – PPO | Admitting: Hospice and Palliative Medicine

## 2020-08-09 ENCOUNTER — Other Ambulatory Visit: Payer: Self-pay

## 2020-08-09 ENCOUNTER — Ambulatory Visit (INDEPENDENT_AMBULATORY_CARE_PROVIDER_SITE_OTHER): Payer: BC Managed Care – PPO | Admitting: Nurse Practitioner

## 2020-08-09 ENCOUNTER — Encounter: Payer: Self-pay | Admitting: Nurse Practitioner

## 2020-08-09 VITALS — BP 137/78 | HR 79 | Temp 98.8°F | Ht 60.0 in | Wt 167.9 lb

## 2020-08-09 DIAGNOSIS — R41840 Attention and concentration deficit: Secondary | ICD-10-CM

## 2020-08-09 DIAGNOSIS — Z6832 Body mass index (BMI) 32.0-32.9, adult: Secondary | ICD-10-CM | POA: Diagnosis not present

## 2020-08-09 DIAGNOSIS — Z7689 Persons encountering health services in other specified circumstances: Secondary | ICD-10-CM | POA: Diagnosis not present

## 2020-08-09 DIAGNOSIS — G43901 Migraine, unspecified, not intractable, with status migrainosus: Secondary | ICD-10-CM

## 2020-08-09 MED ORDER — WEGOVY 0.5 MG/0.5ML ~~LOC~~ SOAJ: 0.5000 mg | SUBCUTANEOUS | 1 refills | Status: DC

## 2020-08-09 MED ORDER — AMPHETAMINE-DEXTROAMPHETAMINE 10 MG PO TABS: ORAL_TABLET | ORAL | 0 refills | Status: DC

## 2020-08-09 MED ORDER — AMPHETAMINE-DEXTROAMPHET ER 20 MG PO CP24: 20.0000 mg | ORAL_CAPSULE | Freq: Every day | ORAL | 0 refills | Status: DC

## 2020-08-09 MED ORDER — PROMETHAZINE HCL 25 MG PO TABS: ORAL_TABLET | ORAL | 2 refills | Status: AC

## 2020-08-09 NOTE — Patient Instructions (Signed)
Calorie Counting for Weight Loss Calories are units of energy. Your body needs a certain number of calories from food to keep going throughout the day. When you eat or drink more calories than your body needs, your body stores the extra calories mostly as fat. When you eat or drink fewer calories than your body needs, your body burns fat to get the energy it needs. Calorie counting means keeping track of how many calories you eat and drink each day. Calorie counting can be helpful if you need to lose weight. If you eat fewer calories than your body needs, you should lose weight. Ask your health care provider what a healthy weight is for you. For calorie counting to work, you will need to eat the right number of calories each day to lose a healthy amount of weight per week. A dietitian can help you figure out how many calories you need in a day and will suggest ways to reach your calorie goal.  A healthy amount of weight to lose each week is usually 1-2 lb (0.5-0.9 kg). This usually means that your daily calorie intake should be reduced by 500-750 calories.  Eating 1,200-1,500 calories a day can help most women lose weight.  Eating 1,500-1,800 calories a day can help most men lose weight. What do I need to know about calorie counting? Work with your health care provider or dietitian to determine how many calories you should get each day. To meet your daily calorie goal, you will need to:  Find out how many calories are in each food that you would like to eat. Try to do this before you eat.  Decide how much of the food you plan to eat.  Keep a food log. Do this by writing down what you ate and how many calories it had. To successfully lose weight, it is important to balance calorie counting with a healthy lifestyle that includes regular activity. Where do I find calorie information? The number of calories in a food can be found on a Nutrition Facts label. If a food does not have a Nutrition Facts  label, try to look up the calories online or ask your dietitian for help. Remember that calories are listed per serving. If you choose to have more than one serving of a food, you will have to multiply the calories per serving by the number of servings you plan to eat. For example, the label on a package of bread might say that a serving size is 1 slice and that there are 90 calories in a serving. If you eat 1 slice, you will have eaten 90 calories. If you eat 2 slices, you will have eaten 180 calories.   How do I keep a food log? After each time that you eat, record the following in your food log as soon as possible:  What you ate. Be sure to include toppings, sauces, and other extras on the food.  How much you ate. This can be measured in cups, ounces, or number of items.  How many calories were in each food and drink.  The total number of calories in the food you ate. Keep your food log near you, such as in a pocket-sized notebook or on an app or website on your mobile phone. Some programs will calculate calories for you and show you how many calories you have left to meet your daily goal. What are some portion-control tips?  Know how many calories are in a serving. This will   help you know how many servings you can have of a certain food.  Use a measuring cup to measure serving sizes. You could also try weighing out portions on a kitchen scale. With time, you will be able to estimate serving sizes for some foods.  Take time to put servings of different foods on your favorite plates or in your favorite bowls and cups so you know what a serving looks like.  Try not to eat straight from a food's packaging, such as from a bag or box. Eating straight from the package makes it hard to see how much you are eating and can lead to overeating. Put the amount you would like to eat in a cup or on a plate to make sure you are eating the right portion.  Use smaller plates, glasses, and bowls for smaller  portions and to prevent overeating.  Try not to multitask. For example, avoid watching TV or using your computer while eating. If it is time to eat, sit down at a table and enjoy your food. This will help you recognize when you are full. It will also help you be more mindful of what and how much you are eating. What are tips for following this plan? Reading food labels  Check the calorie count compared with the serving size. The serving size may be smaller than what you are used to eating.  Check the source of the calories. Try to choose foods that are high in protein, fiber, and vitamins, and low in saturated fat, trans fat, and sodium. Shopping  Read nutrition labels while you shop. This will help you make healthy decisions about which foods to buy.  Pay attention to nutrition labels for low-fat or fat-free foods. These foods sometimes have the same number of calories or more calories than the full-fat versions. They also often have added sugar, starch, or salt to make up for flavor that was removed with the fat.  Make a grocery list of lower-calorie foods and stick to it. Cooking  Try to cook your favorite foods in a healthier way. For example, try baking instead of frying.  Use low-fat dairy products. Meal planning  Use more fruits and vegetables. One-half of your plate should be fruits and vegetables.  Include lean proteins, such as chicken, turkey, and fish. Lifestyle Each week, aim to do one of the following:  150 minutes of moderate exercise, such as walking.  75 minutes of vigorous exercise, such as running. General information  Know how many calories are in the foods you eat most often. This will help you calculate calorie counts faster.  Find a way of tracking calories that works for you. Get creative. Try different apps or programs if writing down calories does not work for you. What foods should I eat?  Eat nutritious foods. It is better to have a nutritious,  high-calorie food, such as an avocado, than a food with few nutrients, such as a bag of potato chips.  Use your calories on foods and drinks that will fill you up and will not leave you hungry soon after eating. ? Examples of foods that fill you up are nuts and nut butters, vegetables, lean proteins, and high-fiber foods such as whole grains. High-fiber foods are foods with more than 5 g of fiber per serving.  Pay attention to calories in drinks. Low-calorie drinks include water and unsweetened drinks. The items listed above may not be a complete list of foods and beverages you can eat.   Contact a dietitian for more information.   What foods should I limit? Limit foods or drinks that are not good sources of vitamins, minerals, or protein or that are high in unhealthy fats. These include:  Candy.  Other sweets.  Sodas, specialty coffee drinks, alcohol, and juice. The items listed above may not be a complete list of foods and beverages you should avoid. Contact a dietitian for more information. How do I count calories when eating out?  Pay attention to portions. Often, portions are much larger when eating out. Try these tips to keep portions smaller: ? Consider sharing a meal instead of getting your own. ? If you get your own meal, eat only half of it. Before you start eating, ask for a container and put half of your meal into it. ? When available, consider ordering smaller portions from the menu instead of full portions.  Pay attention to your food and drink choices. Knowing the way food is cooked and what is included with the meal can help you eat fewer calories. ? If calories are listed on the menu, choose the lower-calorie options. ? Choose dishes that include vegetables, fruits, whole grains, low-fat dairy products, and lean proteins. ? Choose items that are boiled, broiled, grilled, or steamed. Avoid items that are buttered, battered, fried, or served with cream sauce. Items labeled as  crispy are usually fried, unless stated otherwise. ? Choose water, low-fat milk, unsweetened iced tea, or other drinks without added sugar. If you want an alcoholic beverage, choose a lower-calorie option, such as a glass of wine or light beer. ? Ask for dressings, sauces, and syrups on the side. These are usually high in calories, so you should limit the amount you eat. ? If you want a salad, choose a garden salad and ask for grilled meats. Avoid extra toppings such as bacon, cheese, or fried items. Ask for the dressing on the side, or ask for olive oil and vinegar or lemon to use as dressing.  Estimate how many servings of a food you are given. Knowing serving sizes will help you be aware of how much food you are eating at restaurants. Where to find more information  Centers for Disease Control and Prevention: www.cdc.gov  [place] Department of Agriculture: myplate.gov Summary  Calorie counting means keeping track of how many calories you eat and drink each day. If you eat fewer calories than your body needs, you should lose weight.  A healthy amount of weight to lose per week is usually 1-2 lb (0.5-0.9 kg). This usually means reducing your daily calorie intake by 500-750 calories.  The number of calories in a food can be found on a Nutrition Facts label. If a food does not have a Nutrition Facts label, try to look up the calories online or ask your dietitian for help.  Use smaller plates, glasses, and bowls for smaller portions and to prevent overeating.  Use your calories on foods and drinks that will fill you up and not leave you hungry shortly after a meal. This information is not intended to replace advice given to you by your health care provider. Make sure you discuss any questions you have with your health care provider. Document Revised: 05/18/2019 Document Reviewed: 05/18/2019 Elsevier Patient Education  2021 [employer]  

## 2020-08-09 NOTE — Progress Notes (Signed)
New Patient Office Visit  Subjective:  Patient ID: Dawn Spencer, female    DOB: Jan 06, 1966  Age: 55 y.o. MRN: [id number]  CC:  Chief Complaint  Patient presents with  . New Patient (Initial Visit)    HPI DESARE DIERS presents to establish care in new primary care office keeping her prior pcp and maintaining the continuity of care. Her biggest concern is difficulty with ability to lose weight. She states that she did haeve trial of saxenda. Did very well on this medication, however, insurance would not approve coverage. She was provided a list of alternatives her insurance will cover. She cannot take stimulant appetite suppressants, as she does take stimulants to help with focus and concentration. As a result, will do trial of Wegovy. She is limiting her caloric intake to 1200-1500 calories per day and exercising 3 to 4 days per week. She has lost approximately 19 pounds since she was seen by me at her last outpatient visit.  The patient is treated for adult ADHD. She was given a ASRS 1.1 at an office visit 03/2020, scoring 4/6 with many criteria to support this diagnosis. She did have trial of vyvanse. Did start on 40mg  daily when needed then 50mg  daily. She states that she did much better when taking adderall. In past whe was taking a dose of adderall XR 20mg  in the mornings. If needed, she would take an Adderall 10mg  in the afternoons. She was able to focus, stay on track while at work and at school, and did not have negative side effects. I reviewed her PDMP today. Her Overdose Risk Factor is 000 and she has not had any controlled medications filled since 03/2020.  She had annual wellness visit with pap smear in 12/2019  Routine, fasting labs were done 03/2020.  Past Medical History:  Diagnosis Date  . ADD (attention deficit disorder)   . Allergic rhinitis   . Bleeding from nipple in female 08/25/07  . Complication of anesthesia 1997   difficult spinal - "Couldn't get it to take,  but have had one since that was fine"  . Diverticulitis   . Generalized headaches    migraines  . History of insomnia   . PONV (postoperative nausea and vomiting)     Past Surgical History:  Procedure Laterality Date  . ABDOMINAL HYSTERECTOMY N/A    Phreesia 08/08/2020  . CESAREAN SECTION  90,95, 97   X3  . HYSTEROSCOPY N/A 12/12/2013   Procedure: HYSTEROSCOPY WITH DILATION;  Surgeon: Donnamae Jude, MD;  Location: [facility];  Service: Gynecology;  Laterality: N/A;  . KNEE ARTHROSCOPY Right 08/06/2017   Procedure: ARTHROSCOPY KNEE;  Surgeon: Frederik Pear, MD;  Location: [facility];  Service: Orthopedics;  Laterality: Right;  medial minsectomy, condral malsia, femeral condil  . TONSILLECTOMY    . VAGINAL HYSTERECTOMY N/A 04/17/2014   Procedure: HYSTERECTOMY VAGINAL / CYSTOSCOPY;  Surgeon: Donnamae Jude, MD;  Location: [facility];  Service: Gynecology;  Laterality: N/A;    Family History  Problem Relation Age of Onset  . Heart disease Paternal Grandfather   . Cancer Maternal Grandmother        BREAST   . Osteoporosis Mother   . Breast cancer Mother   . Cancer Paternal Aunt        breast caner    Social History   Socioeconomic History  . Marital status: Married    Spouse name: Not on file  . Number of children: Not on file  .  Years of education: Not on file  . Highest education level: Not on file  Occupational History  . Not on file  Tobacco Use  . Smoking status: Never Smoker  . Smokeless tobacco: Never Used  Vaping Use  . Vaping Use: Never used  Substance and Sexual Activity  . Alcohol use: Never    Alcohol/week: 0.0 standard drinks    Comment: occasional  . Drug use: No  . Sexual activity: Yes    Partners: Male    Birth control/protection: Surgical  Other Topics Concern  . Not on file  Social History Narrative  . Not on file   Social Determinants of Health   Financial Resource Strain: Not on file  Food Insecurity: Not on file  Transportation Needs: Not on file   Physical Activity: Not on file  Stress: Not on file  Social Connections: Not on file  Intimate Partner Violence: Not on file    ROS Review of Systems  Constitutional: Positive for fatigue. Negative for chills and fever.       Patient states that she continues to struggle with weight loss.   HENT: Negative for congestion, postnasal drip, rhinorrhea, sinus pressure and sinus pain.   Eyes: Negative.   Respiratory: Negative for cough, shortness of breath and wheezing.   Cardiovascular: Negative for chest pain and palpitations.  Gastrointestinal: Negative for constipation, diarrhea, nausea and vomiting.  Endocrine: Negative.   Musculoskeletal: Negative for arthralgias, back pain and myalgias.  Skin: Negative for rash.  Allergic/Immunologic: Negative for environmental allergies.  Neurological: Positive for headaches. Negative for dizziness and weakness.       History of migraine headaches which are currently well controlled.   Psychiatric/Behavioral: Positive for dysphoric mood. The patient is nervous/anxious.   All other systems reviewed and are negative.   Objective:   Today's Vitals   08/09/20 1016  BP: 137/78  Pulse: 79  Temp: 98.8 F (37.1 C)  SpO2: 97%  Weight: 167 lb 14.4 oz (76.2 kg)  Height: 5' (1.524 m)   Body mass index is 32.79 kg/m.   Physical Exam Vitals and nursing note reviewed.  Constitutional:      Appearance: Normal appearance. She is well-developed.  HENT:     Head: Normocephalic and atraumatic.     Nose: Nose normal.  Eyes:     Extraocular Movements: Extraocular movements intact.     Conjunctiva/sclera: Conjunctivae normal.     Pupils: Pupils are equal, round, and reactive to light.  Cardiovascular:     Rate and Rhythm: Normal rate and regular rhythm.     Pulses: Normal pulses.     Heart sounds: Normal heart sounds.  Pulmonary:     Effort: Pulmonary effort is normal.     Breath sounds: Normal breath sounds.  Abdominal:     Palpations:  Abdomen is soft.  Musculoskeletal:        General: Normal range of motion.     Cervical back: Normal range of motion and neck supple.  Skin:    General: Skin is warm and dry.     Capillary Refill: Capillary refill takes less than 2 seconds.  Neurological:     General: No focal deficit present.     Mental Status: She is alert and oriented to person, place, and time.  Psychiatric:        Mood and Affect: Mood normal.        Behavior: Behavior normal.        Thought Content: Thought content normal.  Judgment: Judgment normal.     Comments: Patient scored 4/6 on part 1 of ASRS 1.1 in 03/2020     Assessment & Plan:  1. Encounter to establish care Appointment today to establish care in new primary care office, keeping her prior PCP and maintaining continuity of care.   2. Migraine with status migrainosus, not intractable, unspecified migraine type Continue abortive therapy as needed and as prescribed. Use promethazine as needed and as prescribed to relieve nausea/vomiting related to migraines.  - promethazine (PHENERGAN) 25 MG tablet; To take po PRN during migraine headaches  Dispense: 30 tablet; Refill: 2  3. BMI 32.0-32.9,adult Trial Wegovy injections. Advised her to start with 0.25mg  injection weekly for three weeks then increase to 0.5mg  weekly. Advised she limit calorie intake to 1200-1500 calories per days and incorporate exercise into her daily routine  - Semaglutide-Weight Management (WEGOVY) 0.5 MG/0.5ML SOAJ; Inject 0.5 mg into the skin once a week.  Dispense: 4 mL; Refill: 1  4. Attention and concentration deficit Patient scoring 4/6 on part 1 of ASRS 1.1 in 03/2020. Did better with Adderall XR daily and IR in afternoons if needed. Will restart adderall XR 20mg  daily. May take adderall 10mg  daily in afternoons if needed. Three 30 day prescriptions sent to her pharmacy. Dates are 08/09/2020, 09/06/2020, and 10/05/2020.  - amphetamine-dextroamphetamine (ADDERALL XR) 20 MG 24 hr  capsule; Take 1 capsule (20 mg total) by mouth daily.  Dispense: 30 capsule; Refill: 0 - amphetamine-dextroamphetamine (ADDERALL) 10 MG tablet; Take 1 tablet po QPM if needed for focus and concentration.  Dispense: 30 tablet; Refill: 0  Problem List Items Addressed This Visit      Cardiovascular and Mediastinum   Migraine with status migrainosus, not intractable   Relevant Medications   promethazine (PHENERGAN) 25 MG tablet     Other   Attention and concentration deficit   Relevant Medications   amphetamine-dextroamphetamine (ADDERALL XR) 20 MG 24 hr capsule   amphetamine-dextroamphetamine (ADDERALL) 10 MG tablet   Encounter to establish care - Primary   BMI 32.0-32.9,adult   Relevant Medications   Semaglutide-Weight Management (WEGOVY) 0.5 MG/0.5ML SOAJ      Outpatient Encounter Medications as of 08/09/2020  Medication Sig  . Biotin 10 MG CAPS Take 10 mg by mouth daily.  . Calcium Carb-Cholecalciferol (CALCIUM 600/VITAMIN D3 PO) Take 1 tablet by mouth daily.  . cetirizine (ZYRTEC) 10 MG tablet Take 10 mg by mouth daily.  . Cyanocobalamin (B-12) 5000 MCG CAPS Take by mouth.  . cyclobenzaprine (FLEXERIL) 10 MG tablet Take 1 tablet (10 mg total) by mouth at bedtime.  . ergocalciferol (DRISDOL) 1.25 MG (50000 UT) capsule Take 1 capsule (50,000 Units total) by mouth once a week.  Marland Kitchen FIBER PO Take 1 capsule by mouth daily. Fiber Well 5 g Probiotic  . gabapentin (NEURONTIN) 300 MG capsule Take 1 to 2 capsules po QHS prn  . ibuprofen (ADVIL,MOTRIN) 200 MG tablet Take 600-800 mg by mouth every 6 (six) hours as needed for headache or moderate pain.  . Multiple Vitamins-Minerals (MULTIVITAMIN GUMMIES WOMENS PO) Take 2 each by mouth daily.  . naproxen sodium (ALEVE) 220 MG tablet Take 440 mg by mouth daily as needed (for pain or headache).  Marland Kitchen omeprazole (PRILOSEC) 20 MG capsule Take 20 mg by mouth daily.  . rizatriptan (MAXALT) 10 MG tablet Take 1 tablet (10 mg total) by mouth as needed for  migraine.  . Semaglutide-Weight Management (WEGOVY) 0.5 MG/0.5ML SOAJ Inject 0.5 mg into the skin once a  week.  Marland Kitchen SUMAtriptan (IMITREX) 6 MG/0.5ML SOLN injection Inject 0.5 mLs (6 mg total) into the skin every 2 (two) hours as needed for migraine or headache.  . [DISCONTINUED] amphetamine-dextroamphetamine (ADDERALL XR) 20 MG 24 hr capsule Take 1 capsule (20 mg total) by mouth daily.  . [DISCONTINUED] amphetamine-dextroamphetamine (ADDERALL) 10 MG tablet Take 1 tablet po QPM if needed for focus and concentration.  . [DISCONTINUED] lisdexamfetamine (VYVANSE) 50 MG capsule Take 1 capsule (50 mg total) by mouth daily.  . [DISCONTINUED] promethazine (PHENERGAN) 25 MG tablet Take 1 tablet (25 mg total) by mouth every 6 (six) hours as needed for nausea or vomiting.  Marland Kitchen amphetamine-dextroamphetamine (ADDERALL XR) 20 MG 24 hr capsule Take 1 capsule (20 mg total) by mouth daily.  Marland Kitchen amphetamine-dextroamphetamine (ADDERALL) 10 MG tablet Take 1 tablet po QPM if needed for focus and concentration.  . promethazine (PHENERGAN) 25 MG tablet To take po PRN during migraine headaches  . [DISCONTINUED] amphetamine-dextroamphetamine (ADDERALL XR) 20 MG 24 hr capsule Take 1 capsule (20 mg total) by mouth daily.  . [DISCONTINUED] amphetamine-dextroamphetamine (ADDERALL) 10 MG tablet Take 1 tablet po QPM if needed for focus and concentration.   No facility-administered encounter medications on file as of 08/09/2020.    Follow-up: Return in about 3 months (around 11/08/2020) for weight management, ADD .   Ronnell Freshwater, NP

## 2020-08-11 ENCOUNTER — Encounter: Payer: Self-pay | Admitting: Nurse Practitioner

## 2020-08-11 DIAGNOSIS — Z7689 Persons encountering health services in other specified circumstances: Secondary | ICD-10-CM | POA: Insufficient documentation

## 2020-08-11 DIAGNOSIS — Z6832 Body mass index (BMI) 32.0-32.9, adult: Secondary | ICD-10-CM | POA: Insufficient documentation

## 2020-08-11 DIAGNOSIS — G43901 Migraine, unspecified, not intractable, with status migrainosus: Secondary | ICD-10-CM | POA: Insufficient documentation

## 2020-08-11 MED ORDER — AMPHETAMINE-DEXTROAMPHETAMINE 10 MG PO TABS: ORAL_TABLET | ORAL | 0 refills | Status: DC

## 2020-08-11 MED ORDER — AMPHETAMINE-DEXTROAMPHET ER 20 MG PO CP24: 20.0000 mg | ORAL_CAPSULE | Freq: Every day | ORAL | 0 refills | Status: DC

## 2020-08-12 ENCOUNTER — Other Ambulatory Visit: Payer: Self-pay | Admitting: Nurse Practitioner

## 2020-08-12 DIAGNOSIS — R41840 Attention and concentration deficit: Secondary | ICD-10-CM

## 2020-08-12 MED ORDER — AMPHETAMINE-DEXTROAMPHETAMINE 30 MG PO TABS: 30.0000 mg | ORAL_TABLET | Freq: Two times a day (BID) | ORAL | 0 refills | Status: DC

## 2020-08-12 NOTE — Progress Notes (Signed)
Changed adderall prescription to Adderall 30mg  twice daily as needed. Three 30 day prescriptions sent to pharmacy. Dates are 08/12/2020, 09/09/2020, and 10/08/2020. Asked that pharmacy discontinue prior prescriptions for Adderall XR 20mg  and Adderall 10mg  in afternoons. These prescriptions were not filled or picked up per PDMP profile.

## 2020-09-18 ENCOUNTER — Telehealth: Payer: Self-pay | Admitting: Nurse Practitioner

## 2020-09-18 NOTE — Telephone Encounter (Signed)
Left msg for patient to call back. AS, CMA 

## 2020-09-18 NOTE — Telephone Encounter (Signed)
Patient negative for covid. Scheduled telehealth apt 09/19/20. AS, CMA

## 2020-09-18 NOTE — Telephone Encounter (Signed)
Patient thinks she might have a sinus infection. Symptoms include, headache, cough, congestion, face hurting, sore throat, low grade fever. Patient has not taken a COVID test and was advised to and call back. Thanks

## 2020-09-19 ENCOUNTER — Ambulatory Visit (INDEPENDENT_AMBULATORY_CARE_PROVIDER_SITE_OTHER): Payer: BC Managed Care – PPO | Admitting: Nurse Practitioner

## 2020-09-19 ENCOUNTER — Encounter: Payer: Self-pay | Admitting: Nurse Practitioner

## 2020-09-19 VITALS — Temp 98.6°F | Ht 60.0 in | Wt 167.0 lb

## 2020-09-19 DIAGNOSIS — J0140 Acute pansinusitis, unspecified: Secondary | ICD-10-CM | POA: Insufficient documentation

## 2020-09-19 MED ORDER — AZITHROMYCIN 250 MG PO TABS: ORAL_TABLET | ORAL | 0 refills | Status: DC

## 2020-09-19 NOTE — Patient Instructions (Signed)

## 2020-09-19 NOTE — Progress Notes (Signed)
Virtual Visit via Telephone Note  I connected with Dawn Spencer on 09/19/20 at  9:50 AM EDT by telephone and verified that I am speaking with the correct person using two identifiers.  Location: Patient: home  Provider:  primary care at [facility]    I discussed the limitations, risks, security and privacy concerns of performing an evaluation and management service by telephone and the availability of in person appointments. I also discussed with the patient that there may be a patient responsible charge related to this service. The patient expressed understanding and agreed to proceed.   History of Present Illness: The patient presents for evaluation of probable sinusitis. She states that she started with sore throat last Thursday. On Saturday, she developed sinus congestion, headache, and post nasal drip. She has headache and facial pain in the areas of her frontal and maxillary sinuses. She has run low grade fever off and on. She has been taking ibuprofen and mucinex which do help the symptosm some, but the symptoms worsen when medications wear off. She has had negative COVID 19 test, both rapid and PCT testing results were negative. She states that symptoms are moderate in severity and not getting better or worse at this time.    Observations/Objective:  The patient is alert and oriented. She is pleasant and answers all questions appropriately. Breathing is non-labored. She is in no acute distress at this time. She sounds nasally congested on the phone. She has mild, dry cough intermittently during the visit.   Today's Vitals   09/19/20 0954  Temp: 98.6 F (37 C)  Weight: 167 lb (75.8 kg)  Height: 5' (1.524 m)   Body mass index is 32.61 kg/m.  Assessment and Plan: 1. Acute non-recurrent pansinusitis Start z-pack. Take as directed for 5 days. Rest and increase fluid intake. Continue to take ibuprofen and mucinex as needed and as indicated. Advised her to contact the  office if symptoms worsen or do not improve in next 5 to 7 days. She voiced understanding and agreement with this plan.  - azithromycin (ZITHROMAX) 250 MG tablet; z-pack - take as directed for 5 days  Dispense: 6 tablet; Refill: 0  Follow Up Instructions:    I discussed the assessment and treatment plan with the patient. The patient was provided an opportunity to ask questions and all were answered. The patient agreed with the plan and demonstrated an understanding of the instructions.   The patient was advised to call back or seek an in-person evaluation if the symptoms worsen or if the condition fails to improve as anticipated.  I provided 15 minutes of non-face-to-face time during this encounter.   Ronnell Freshwater, NP

## 2020-11-05 ENCOUNTER — Ambulatory Visit (INDEPENDENT_AMBULATORY_CARE_PROVIDER_SITE_OTHER): Payer: BC Managed Care – PPO | Admitting: Nurse Practitioner

## 2020-11-05 ENCOUNTER — Encounter: Payer: Self-pay | Admitting: Nurse Practitioner

## 2020-11-05 ENCOUNTER — Other Ambulatory Visit: Payer: Self-pay

## 2020-11-05 VITALS — BP 110/71 | HR 72 | Temp 97.3°F | Ht 60.0 in | Wt 147.0 lb

## 2020-11-05 DIAGNOSIS — G2581 Restless legs syndrome: Secondary | ICD-10-CM | POA: Diagnosis not present

## 2020-11-05 DIAGNOSIS — G43911 Migraine, unspecified, intractable, with status migrainosus: Secondary | ICD-10-CM | POA: Diagnosis not present

## 2020-11-05 DIAGNOSIS — R41840 Attention and concentration deficit: Secondary | ICD-10-CM | POA: Diagnosis not present

## 2020-11-05 DIAGNOSIS — Z6828 Body mass index (BMI) 28.0-28.9, adult: Secondary | ICD-10-CM

## 2020-11-05 MED ORDER — GABAPENTIN 300 MG PO CAPS: ORAL_CAPSULE | ORAL | 3 refills | Status: DC

## 2020-11-05 MED ORDER — AMPHETAMINE-DEXTROAMPHETAMINE 30 MG PO TABS: 30.0000 mg | ORAL_TABLET | Freq: Two times a day (BID) | ORAL | 0 refills | Status: DC

## 2020-11-05 MED ORDER — RIZATRIPTAN BENZOATE 10 MG PO TABS: 10.0000 mg | ORAL_TABLET | ORAL | 5 refills | Status: DC | PRN

## 2020-11-05 NOTE — Progress Notes (Signed)
Established Patient Office Visit  Subjective:  Patient ID: Dawn Spencer, female    DOB: 1966-01-31  Age: 55 y.o. MRN: [id number]  CC:  Chief Complaint  Patient presents with   Weight Check    HPI Dawn Spencer presents for routine follow-up visit.  States that prescription for weekly has never approved onto her most recent visit with me.  She did change her eating habits.  She has decreased her portion sizes and increased her intake of vegetables and low-fat foods.  She is exercising more frequently.  She is including strength training.  She has lost 20 pounds since her most recent visit with me.  She states she has increased energy levels. She does continue to take Adderall 30 mg tablets twice daily when needed.  She was administered an ASRS 1.1 self-assessment test in December 2021, scoring 4/6, indicating a propensity towards adults attention deficit disorder, primarily inattentive type.  She will be due to take a new ASRS at her next visit.  She will also need to complete a controlled substances agreement for primary care at [facility] at her next visit.  Her PDMP profile was reviewed today.  Her overdose risk score was 100.  Her last fill of Adderall was 10/09/2020, and was appropriate.  She reports no negative side effects or concerns associated with taking this medication.  It does keep her focused and on track while at work and while taking classes to finish her bachelor's degree. She reports no new physical concerns or complaints.  She denies chest pain, chest pressure, or shortness of breath. She denies headaches or visual disturbances. She denies abdominal pain, nausea, vomiting, or changes in bowel or bladder habits.    Past Medical History:  Diagnosis Date   ADD (attention deficit disorder)    Allergic rhinitis    Bleeding from nipple in female 10/21/39   Complication of anesthesia 1997   difficult spinal - "Couldn't get it to take, but have had one since that was fine"    Diverticulitis    Generalized headaches    migraines   History of insomnia    PONV (postoperative nausea and vomiting)     Past Surgical History:  Procedure Laterality Date   ABDOMINAL HYSTERECTOMY N/A    Phreesia 08/08/2020   CESAREAN SECTION  90,95, 97   X3   HYSTEROSCOPY N/A 12/12/2013   Procedure: HYSTEROSCOPY WITH DILATION;  Surgeon: Donnamae Jude, MD;  Location: [facility];  Service: Gynecology;  Laterality: N/A;   KNEE ARTHROSCOPY Right 08/06/2017   Procedure: ARTHROSCOPY KNEE;  Surgeon: Frederik Pear, MD;  Location: [place];  Service: Orthopedics;  Laterality: Right;  medial minsectomy, condral malsia, femeral condil   TONSILLECTOMY     VAGINAL HYSTERECTOMY N/A 04/17/2014   Procedure: HYSTERECTOMY VAGINAL / CYSTOSCOPY;  Surgeon: Donnamae Jude, MD;  Location: [facility];  Service: Gynecology;  Laterality: N/A;    Family History  Problem Relation Age of Onset   Heart disease Paternal Grandfather    Cancer Maternal Grandmother        BREAST    Osteoporosis Mother    Breast cancer Mother    Cancer Paternal Aunt        breast caner    Social History   Socioeconomic History   Marital status: Married    Spouse name: Not on file   Number of children: Not on file   Years of education: Not on file   Highest education level: Not on file  Occupational History   Not on file  Tobacco Use   Smoking status: Never   Smokeless tobacco: Never  Vaping Use   Vaping Use: Never used  Substance and Sexual Activity   Alcohol use: Never    Alcohol/week: 0.0 standard drinks    Comment: occasional   Drug use: No   Sexual activity: Yes    Partners: Male    Birth control/protection: Surgical  Other Topics Concern   Not on file  Social History Narrative   Not on file   Social Determinants of Health   Financial Resource Strain: Not on file  Food Insecurity: Not on file  Transportation Needs: Not on file  Physical Activity: Not on file  Stress: Not on file  Social Connections: Not on  file  Intimate Partner Violence: Not on file    Outpatient Medications Prior to Visit  Medication Sig Dispense Refill   cetirizine (ZYRTEC) 10 MG tablet Take 10 mg by mouth daily.     Cyanocobalamin (B-12) 5000 MCG CAPS Take by mouth.     FIBER PO Take 1 capsule by mouth daily. Fiber Well 5 g Probiotic     ibuprofen (ADVIL,MOTRIN) 200 MG tablet Take 600-800 mg by mouth every 6 (six) hours as needed for headache or moderate pain.     Multiple Vitamins-Minerals (MULTIVITAMIN GUMMIES WOMENS PO) Take 2 each by mouth daily.     omeprazole (PRILOSEC) 20 MG capsule Take 20 mg by mouth daily.     promethazine (PHENERGAN) 25 MG tablet To take po PRN during migraine headaches 30 tablet 2   amphetamine-dextroamphetamine (ADDERALL) 30 MG tablet Take 1 tablet by mouth 2 (two) times daily. 60 tablet 0   gabapentin (NEURONTIN) 300 MG capsule Take 1 to 2 capsules po QHS prn 60 capsule 3   rizatriptan (MAXALT) 10 MG tablet Take 1 tablet (10 mg total) by mouth as needed for migraine. 10 tablet 5   ergocalciferol (DRISDOL) 1.25 MG (50000 UT) capsule Take 1 capsule (50,000 Units total) by mouth once a week. (Patient not taking: Reported on 11/05/2020) 4 capsule 5   amphetamine-dextroamphetamine (ADDERALL) 30 MG tablet Take 1 tablet by mouth 2 (two) times daily. 60 tablet 0   amphetamine-dextroamphetamine (ADDERALL) 30 MG tablet Take 1 tablet by mouth 2 (two) times daily. 60 tablet 0   azithromycin (ZITHROMAX) 250 MG tablet z-pack - take as directed for 5 days 6 tablet 0   Biotin 10 MG CAPS Take 10 mg by mouth daily. (Patient not taking: Reported on 09/19/2020)     Calcium Carb-Cholecalciferol (CALCIUM 600/VITAMIN D3 PO) Take 1 tablet by mouth daily.     cyclobenzaprine (FLEXERIL) 10 MG tablet Take 1 tablet (10 mg total) by mouth at bedtime. (Patient not taking: Reported on 09/19/2020) 30 tablet 2   naproxen sodium (ALEVE) 220 MG tablet Take 440 mg by mouth daily as needed (for pain or headache). (Patient not taking:  Reported on 11/05/2020)     Semaglutide-Weight Management (WEGOVY) 0.5 MG/0.5ML SOAJ Inject 0.5 mg into the skin once a week. 4 mL 1   SUMAtriptan (IMITREX) 6 MG/0.5ML SOLN injection Inject 0.5 mLs (6 mg total) into the skin every 2 (two) hours as needed for migraine or headache. 5 vial 1   No facility-administered medications prior to visit.    No Known Allergies  ROS Review of Systems  Constitutional:  Negative for chills, fatigue and fever.       Patient has had a 20 pound weight loss since her  most recent visit to me.  She has been limiting motion sizes and increasing exercise in order to lose weight without additional medical intervention.  HENT:  Negative for congestion, postnasal drip, rhinorrhea, sinus pressure and sinus pain.   Eyes: Negative.   Respiratory:  Negative for cough, chest tightness, shortness of breath and wheezing.   Cardiovascular:  Negative for chest pain and palpitations.  Gastrointestinal:  Negative for constipation, diarrhea, nausea and vomiting.  Endocrine: Negative for cold intolerance, heat intolerance, polydipsia and polyuria.  Musculoskeletal:  Negative for arthralgias, back pain and myalgias.  Skin:  Negative for rash.  Allergic/Immunologic: Negative.  Negative for environmental allergies.  Neurological:  Positive for headaches. Negative for dizziness and weakness.       History of migraine headaches which are currently well controlled.   Psychiatric/Behavioral:  Positive for dysphoric mood. The patient is nervous/anxious.      Objective:    Physical Exam Vitals and nursing note reviewed.  Constitutional:      Appearance: Normal appearance. She is well-developed.  HENT:     Head: Normocephalic and atraumatic.     Nose: Nose normal.     Mouth/Throat:     Mouth: Mucous membranes are moist.  Eyes:     Extraocular Movements: Extraocular movements intact.     Conjunctiva/sclera: Conjunctivae normal.     Pupils: Pupils are equal, round, and reactive  to light.  Cardiovascular:     Rate and Rhythm: Normal rate and regular rhythm.     Pulses: Normal pulses.     Heart sounds: Normal heart sounds.  Pulmonary:     Effort: Pulmonary effort is normal.     Breath sounds: Normal breath sounds.  Abdominal:     Palpations: Abdomen is soft.  Musculoskeletal:        General: Normal range of motion.     Cervical back: Normal range of motion and neck supple.  Lymphadenopathy:     Cervical: No cervical adenopathy.  Skin:    General: Skin is warm and dry.     Capillary Refill: Capillary refill takes less than 2 seconds.  Neurological:     General: No focal deficit present.     Mental Status: She is alert and oriented to person, place, and time.  Psychiatric:        Mood and Affect: Mood normal.        Behavior: Behavior normal.        Thought Content: Thought content normal.        Judgment: Judgment normal.    Today's Vitals   11/05/20 1347  BP: 110/71  Pulse: 72  Temp: (!) 97.3 F (36.3 C)  SpO2: 100%  Weight: 147 lb (66.7 kg)  Height: 5' (1.524 m)   Body mass index is 28.71 kg/m.   Wt Readings from Last 3 Encounters:  11/05/20 147 lb (66.7 kg)  09/19/20 167 lb (75.8 kg)  08/09/20 167 lb 14.4 oz (76.2 kg)     Health Maintenance Due  Topic Date Due   Pneumococcal Vaccine 74-1 Years old (1 - PCV) Never done   Zoster Vaccines- Shingrix (1 of 2) Never done   MAMMOGRAM  08/06/2019   INFLUENZA VACCINE  11/18/2020    There are no preventive care reminders to display for this patient.  Lab Results  Component Value Date   TSH 1.780 03/27/2020   Lab Results  Component Value Date   WBC 6.6 03/27/2020   HGB 13.1 03/27/2020   HCT 39.4 03/27/2020  MCV 90 03/27/2020   PLT 391 03/27/2020   Lab Results  Component Value Date   NA 142 03/27/2020   K 5.2 03/27/2020   CO2 22 03/27/2020   GLUCOSE 82 03/27/2020   BUN 16 03/27/2020   CREATININE 0.74 03/27/2020   BILITOT 0.2 03/27/2020   ALKPHOS 103 03/27/2020   AST 23  03/27/2020   ALT 13 03/27/2020   PROT 7.3 03/27/2020   ALBUMIN 4.7 03/27/2020   CALCIUM 10.1 03/27/2020   ANIONGAP 3 (L) 02/20/2013   No results found for: CHOL No results found for: HDL No results found for: LDLCALC No results found for: TRIG No results found for: [facility] Lab Results  Component Value Date   HGBA1C 5.3 03/27/2020      Assessment & Plan:  1. Intractable migraine with status migrainosus, unspecified migraine type Migraines are stable.  She should continue to take Maxalt 10 mg tablets as needed and as prescribed for acute migraines.  Refill sent to her pharmacy today. - rizatriptan (MAXALT) 10 MG tablet; Take 1 tablet (10 mg total) by mouth as needed for migraine.  Dispense: 10 tablet; Refill: 5  2. Restless legs syndrome May continue to take gabapentin 300 mg at night, 1 to 2 capsules when needed.  Refills were provided today. - gabapentin (NEURONTIN) 300 MG capsule; Take 1 to 2 capsules po QHS prn  Dispense: 60 capsule; Refill: 3  3. Attention and concentration deficit Patient doing well with current dose of Adderall 30 mg tablets.  She is taking twice daily when needed.  3, 30-day prescriptions were sent to her pharmacy today.  Dates are 11/05/2020, 12/04/2020, and 01/02/2021.  We will have patient complete a controlled substances agreement at her next visit.  We will also have her complete a new ASRS 1.1 at that visit. - amphetamine-dextroamphetamine (ADDERALL) 30 MG tablet; Take 1 tablet by mouth 2 (two) times daily.  Dispense: 60 tablet; Refill: 0 - amphetamine-dextroamphetamine (ADDERALL) 30 MG tablet; Take 1 tablet by mouth 2 (two) times daily.  Dispense: 60 tablet; Refill: 0 - amphetamine-dextroamphetamine (ADDERALL) 30 MG tablet; Take 1 tablet by mouth 2 (two) times daily.  Dispense: 60 tablet; Refill: 0  4. Body mass index 28.0-28.9, adult Patient has had weight loss of 20 pounds since her most recent visit.  She credits diet and lifestyle changes helping her  with weight loss.  She has increased intake of fruits and vegetables and limited portion sizes at meals.  He has increased her physical activity, working out more often and adding strength training to her workout routine.  Will monitor.  Problem List Items Addressed This Visit       Cardiovascular and Mediastinum   Intractable migraine with status migrainosus - Primary   Relevant Medications   rizatriptan (MAXALT) 10 MG tablet   gabapentin (NEURONTIN) 300 MG capsule     Other   Attention and concentration deficit   Relevant Medications   amphetamine-dextroamphetamine (ADDERALL) 30 MG tablet   amphetamine-dextroamphetamine (ADDERALL) 30 MG tablet   amphetamine-dextroamphetamine (ADDERALL) 30 MG tablet   Restless legs syndrome   Relevant Medications   gabapentin (NEURONTIN) 300 MG capsule   Body mass index 28.0-28.9, adult    Meds ordered this encounter  Medications   amphetamine-dextroamphetamine (ADDERALL) 30 MG tablet    Sig: Take 1 tablet by mouth 2 (two) times daily.    Dispense:  60 tablet    Refill:  0    Order Specific Question:   Supervising Provider  Answer:   Beatrice Lecher D [[id number]]   amphetamine-dextroamphetamine (ADDERALL) 30 MG tablet    Sig: Take 1 tablet by mouth 2 (two) times daily.    Dispense:  60 tablet    Refill:  0    Fill on or after 12/04/2020    Order Specific Question:   Supervising Provider    Answer:   Beatrice Lecher D [[id number]]   amphetamine-dextroamphetamine (ADDERALL) 30 MG tablet    Sig: Take 1 tablet by mouth 2 (two) times daily.    Dispense:  60 tablet    Refill:  0    Fill on or after 01/02/2021    Order Specific Question:   Supervising Provider    Answer:   Beatrice Lecher D [[id number]]   rizatriptan (MAXALT) 10 MG tablet    Sig: Take 1 tablet (10 mg total) by mouth as needed for migraine.    Dispense:  10 tablet    Refill:  5    Order Specific Question:   Supervising Provider    Answer:   Beatrice Lecher D [[id number]]    gabapentin (NEURONTIN) 300 MG capsule    Sig: Take 1 to 2 capsules po QHS prn    Dispense:  60 capsule    Refill:  3    Order Specific Question:   Supervising Provider    Answer:   Beatrice Lecher D [[id number]]    Follow-up: Return in about 3 months (around 02/05/2021) for med refills, ADD.   This note was dictated using Systems analyst. Rapid proofreading was performed to expedite the delivery of the information. Despite proofreading, phonetic errors will occur which are common with this voice recognition software. Please take this into consideration. If there are any concerns, please contact our office.    Ronnell Freshwater, NP

## 2020-11-07 ENCOUNTER — Ambulatory Visit: Payer: BC Managed Care – PPO | Admitting: Nurse Practitioner

## 2020-11-20 DIAGNOSIS — Z6828 Body mass index (BMI) 28.0-28.9, adult: Secondary | ICD-10-CM | POA: Insufficient documentation

## 2020-11-20 NOTE — Patient Instructions (Signed)

## 2021-02-10 ENCOUNTER — Encounter: Payer: Self-pay | Admitting: Nurse Practitioner

## 2021-02-10 ENCOUNTER — Ambulatory Visit (INDEPENDENT_AMBULATORY_CARE_PROVIDER_SITE_OTHER): Payer: BC Managed Care – PPO | Admitting: Nurse Practitioner

## 2021-02-10 ENCOUNTER — Other Ambulatory Visit: Payer: Self-pay

## 2021-02-10 VITALS — BP 105/68 | HR 70 | Temp 98.9°F | Ht 60.0 in | Wt 130.5 lb

## 2021-02-10 DIAGNOSIS — Z1231 Encounter for screening mammogram for malignant neoplasm of breast: Secondary | ICD-10-CM

## 2021-02-10 DIAGNOSIS — E663 Overweight: Secondary | ICD-10-CM

## 2021-02-10 DIAGNOSIS — Z23 Encounter for immunization: Secondary | ICD-10-CM

## 2021-02-10 DIAGNOSIS — G2581 Restless legs syndrome: Secondary | ICD-10-CM

## 2021-02-10 DIAGNOSIS — R41840 Attention and concentration deficit: Secondary | ICD-10-CM

## 2021-02-10 MED ORDER — AMPHETAMINE-DEXTROAMPHETAMINE 30 MG PO TABS: 30.0000 mg | ORAL_TABLET | Freq: Two times a day (BID) | ORAL | 0 refills | Status: AC

## 2021-02-10 MED ORDER — AMPHETAMINE-DEXTROAMPHETAMINE 30 MG PO TABS: 30.0000 mg | ORAL_TABLET | Freq: Two times a day (BID) | ORAL | 0 refills | Status: DC

## 2021-02-10 MED ORDER — GABAPENTIN 300 MG PO CAPS: ORAL_CAPSULE | ORAL | 3 refills | Status: DC

## 2021-02-10 NOTE — Patient Instructions (Signed)

## 2021-02-10 NOTE — Progress Notes (Signed)
Established Patient Office Visit  Subjective:  Patient ID: Dawn Spencer, female    DOB: 03-Apr-1966  Age: 55 y.o. MRN: [id number]  CC:  Chief Complaint  Patient presents with   Medication Refill    HPI [name] presents for routine follow up. She continues to follow a healthy eating plan. She has increased exercise. She is doing a gradual walk to run program. She is progressing at her own speed. Has lost 17 pounds since she was last seen in 10/2020. She has improved migraines, both frequency and severity. Blood pressure is well managed.  Needs to have refills for adderall. She takes 30mg  up to twice daily as needed. She states this helps to keep her focused and on track at work and at home. She has no negative side effects. Reviewed her PDMP history. Her overdose risk score is 000 with appropriate fill history  I had he rsign a controlled substances agreeent policy for [facility].  She would like to get her 2nd shingles vaccine while she is here today. Declines flu shot.  She has no new concerns or complaints today.   Past Medical History:  Diagnosis Date   ADD (attention deficit disorder)    Allergic rhinitis    Bleeding from nipple in female 05/29/90   Complication of anesthesia 1997   difficult spinal - "Couldn't get it to take, but have had one since that was fine"   Diverticulitis    Generalized headaches    migraines   History of insomnia    PONV (postoperative nausea and vomiting)     Past Surgical History:  Procedure Laterality Date   ABDOMINAL HYSTERECTOMY N/A    Phreesia 08/08/2020   CESAREAN SECTION  90,95, 97   X3   HYSTEROSCOPY N/A 12/12/2013   Procedure: HYSTEROSCOPY WITH DILATION;  Surgeon: Donnamae Jude, MD;  Location: [facility];  Service: Gynecology;  Laterality: N/A;   KNEE ARTHROSCOPY Right 08/06/2017   Procedure: ARTHROSCOPY KNEE;  Surgeon: Frederik Pear, MD;  Location: [facility];  Service: Orthopedics;  Laterality: Right;   medial minsectomy, condral malsia, femeral condil   TONSILLECTOMY     VAGINAL HYSTERECTOMY N/A 04/17/2014   Procedure: HYSTERECTOMY VAGINAL / CYSTOSCOPY;  Surgeon: Donnamae Jude, MD;  Location: [facility];  Service: Gynecology;  Laterality: N/A;    Family History  Problem Relation Age of Onset   Heart disease Paternal Grandfather    Cancer Maternal Grandmother        BREAST    Osteoporosis Mother    Breast cancer Mother    Cancer Paternal Aunt        breast caner    Social History   Socioeconomic History   Marital status: Married    Spouse name: Not on file   Number of children: Not on file   Years of education: Not on file   Highest education level: Not on file  Occupational History   Not on file  Tobacco Use   Smoking status: Never   Smokeless tobacco: Never  Vaping Use   Vaping Use: Never used  Substance and Sexual Activity   Alcohol use: Never    Alcohol/week: 0.0 standard drinks    Comment: occasional   Drug use: No   Sexual activity: Yes    Partners: Male    Birth control/protection: Surgical  Other Topics Concern   Not on file  Social History Narrative   Not on file   Social Determinants  of Health   Financial Resource Strain: Not on file  Food Insecurity: Not on file  Transportation Needs: Not on file  Physical Activity: Not on file  Stress: Not on file  Social Connections: Not on file  Intimate Partner Violence: Not on file    Outpatient Medications Prior to Visit  Medication Sig Dispense Refill   cetirizine (ZYRTEC) 10 MG tablet Take 10 mg by mouth daily.     Cyanocobalamin (B-12) 5000 MCG CAPS Take by mouth.     FIBER PO Take 1 capsule by mouth daily. Fiber Well 5 g Probiotic     ibuprofen (ADVIL,MOTRIN) 200 MG tablet Take 600-800 mg by mouth every 6 (six) hours as needed for headache or moderate pain.     Multiple Vitamins-Minerals (MULTIVITAMIN GUMMIES WOMENS PO) Take 2 each by mouth daily.     omeprazole (PRILOSEC) 20 MG capsule Take 20 mg by  mouth daily.     promethazine (PHENERGAN) 25 MG tablet To take po PRN during migraine headaches 30 tablet 2   rizatriptan (MAXALT) 10 MG tablet Take 1 tablet (10 mg total) by mouth as needed for migraine. 10 tablet 5   gabapentin (NEURONTIN) 300 MG capsule Take 1 to 2 capsules po QHS prn 60 capsule 3   ergocalciferol (DRISDOL) 1.25 MG (50000 UT) capsule Take 1 capsule (50,000 Units total) by mouth once a week. (Patient not taking: No sig reported) 4 capsule 5   amphetamine-dextroamphetamine (ADDERALL) 30 MG tablet Take 1 tablet by mouth 2 (two) times daily. 60 tablet 0   amphetamine-dextroamphetamine (ADDERALL) 30 MG tablet Take 1 tablet by mouth 2 (two) times daily. 60 tablet 0   amphetamine-dextroamphetamine (ADDERALL) 30 MG tablet Take 1 tablet by mouth 2 (two) times daily. 60 tablet 0   No facility-administered medications prior to visit.    No Known Allergies  ROS Review of Systems  Constitutional:  Negative for activity change, appetite change, chills, fatigue and fever.       17 pound weight loss since she was last seen in office   HENT:  Negative for congestion, postnasal drip, rhinorrhea, sinus pressure, sinus pain, sneezing and sore throat.   Eyes: Negative.   Respiratory:  Negative for cough, chest tightness, shortness of breath and wheezing.   Cardiovascular:  Negative for chest pain and palpitations.  Gastrointestinal:  Negative for abdominal pain, constipation, diarrhea, nausea and vomiting.  Endocrine: Negative for cold intolerance, heat intolerance, polydipsia and polyuria.  Genitourinary:  Negative for dyspareunia, dysuria, flank pain, frequency and urgency.  Musculoskeletal:  Negative for arthralgias, back pain and myalgias.  Skin:  Negative for rash.  Allergic/Immunologic: Negative for environmental allergies.  Neurological:  Negative for dizziness, weakness and headaches.  Hematological:  Negative for adenopathy.  Psychiatric/Behavioral:  Positive for decreased  concentration. The patient is not nervous/anxious.      Objective:    Physical Exam Vitals and nursing note reviewed.  Constitutional:      Appearance: Normal appearance. She is well-developed.  HENT:     Head: Normocephalic and atraumatic.     Nose: Nose normal.     Mouth/Throat:     Mouth: Mucous membranes are moist.  Eyes:     Extraocular Movements: Extraocular movements intact.     Conjunctiva/sclera: Conjunctivae normal.     Pupils: Pupils are equal, round, and reactive to light.  Cardiovascular:     Rate and Rhythm: Normal rate and regular rhythm.     Pulses: Normal pulses.     Heart  sounds: Normal heart sounds.  Pulmonary:     Effort: Pulmonary effort is normal.     Breath sounds: Normal breath sounds.  Abdominal:     Palpations: Abdomen is soft.  Musculoskeletal:        General: Normal range of motion.     Cervical back: Normal range of motion and neck supple.  Lymphadenopathy:     Cervical: No cervical adenopathy.  Skin:    General: Skin is warm and dry.     Capillary Refill: Capillary refill takes less than 2 seconds.  Neurological:     General: No focal deficit present.     Mental Status: She is alert and oriented to person, place, and time.  Psychiatric:        Mood and Affect: Mood normal.        Behavior: Behavior normal.        Thought Content: Thought content normal.        Judgment: Judgment normal.    Today's Vitals   02/10/21 1316  BP: 105/68  Pulse: 70  Temp: 98.9 F (37.2 C)  SpO2: 100%  Weight: 130 lb 8 oz (59.2 kg)  Height: 5' (1.524 m)   Body mass index is 25.49 kg/m.   Wt Readings from Last 3 Encounters:  02/10/21 130 lb 8 oz (59.2 kg)  11/05/20 147 lb (66.7 kg)  09/19/20 167 lb (75.8 kg)     Health Maintenance Due  Topic Date Due   Pneumococcal Vaccine 93-32 Years old (1 - PCV) Never done   MAMMOGRAM  08/06/2019    There are no preventive care reminders to display for this patient.  Lab Results  Component Value Date    TSH 1.780 03/27/2020   Lab Results  Component Value Date   WBC 6.6 03/27/2020   HGB 13.1 03/27/2020   HCT 39.4 03/27/2020   MCV 90 03/27/2020   PLT 391 03/27/2020   Lab Results  Component Value Date   NA 142 03/27/2020   K 5.2 03/27/2020   CO2 22 03/27/2020   GLUCOSE 82 03/27/2020   BUN 16 03/27/2020   CREATININE 0.74 03/27/2020   BILITOT 0.2 03/27/2020   ALKPHOS 103 03/27/2020   AST 23 03/27/2020   ALT 13 03/27/2020   PROT 7.3 03/27/2020   ALBUMIN 4.7 03/27/2020   CALCIUM 10.1 03/27/2020   ANIONGAP 3 (L) 02/20/2013   No results found for: CHOL No results found for: HDL No results found for: LDLCALC No results found for: TRIG No results found for: [facility] Lab Results  Component Value Date   HGBA1C 5.3 03/27/2020      Assessment & Plan:  1. Restless legs syndrome Patient should continue gabapentin 300 mg capsules, taking 1 to 2 capsules every evening.  New prescription sent to her pharmacy today. - gabapentin (NEURONTIN) 300 MG capsule; Take 1 to 2 capsules po QHS prn  Dispense: 60 capsule; Refill: 3  2. Overweight with body mass index (BMI) 25.0-29.9 Discussed lowering calorie intake to 1500 calories per day and incorporating exercise into daily routine to help lose weight. Will monitor.   3. Attention and concentration deficit Patient may continue Adderall 30 mg tablets twice daily when needed. Three 30-day prescriptions were sent to her pharmacy today.  Dates are 02/10/2021, 03/11/2021, and 04/08/2021. - amphetamine-dextroamphetamine (ADDERALL) 30 MG tablet; Take 1 tablet by mouth 2 (two) times daily.  Dispense: 60 tablet; Refill: 0 - amphetamine-dextroamphetamine (ADDERALL) 30 MG tablet; Take 1 tablet by mouth 2 (two) times  daily.  Dispense: 60 tablet; Refill: 0 - amphetamine-dextroamphetamine (ADDERALL) 30 MG tablet; Take 1 tablet by mouth 2 (two) times daily.  Dispense: 60 tablet; Refill: 0  4. Encounter for screening mammogram for malignant neoplasm of  breast An order for screening mammogram was placed today. - MM DIGITAL SCREENING BILATERAL; Future  5. Need for shingles vaccine Initial shingles vaccine administered during today's visit.  Second injection of series to be given at next in office visit. - Varicella-zoster vaccine IM   Problem List Items Addressed This Visit       Other   Attention and concentration deficit   Relevant Medications   amphetamine-dextroamphetamine (ADDERALL) 30 MG tablet   amphetamine-dextroamphetamine (ADDERALL) 30 MG tablet   amphetamine-dextroamphetamine (ADDERALL) 30 MG tablet   Restless legs syndrome - Primary   Relevant Medications   gabapentin (NEURONTIN) 300 MG capsule   Encounter for screening mammogram for malignant neoplasm of breast   Relevant Orders   MM DIGITAL SCREENING BILATERAL   Overweight with body mass index (BMI) 25.0-29.9   Other Visit Diagnoses     Need for shingles vaccine       Relevant Orders   Varicella-zoster vaccine IM (Completed)       Meds ordered this encounter  Medications   amphetamine-dextroamphetamine (ADDERALL) 30 MG tablet    Sig: Take 1 tablet by mouth 2 (two) times daily.    Dispense:  60 tablet    Refill:  0    Order Specific Question:   Supervising Provider    Answer:   Beatrice Lecher D [[id number]]   amphetamine-dextroamphetamine (ADDERALL) 30 MG tablet    Sig: Take 1 tablet by mouth 2 (two) times daily.    Dispense:  60 tablet    Refill:  0    Fill on or after 04/08/2021    Order Specific Question:   Supervising Provider    Answer:   Beatrice Lecher D [[id number]]   amphetamine-dextroamphetamine (ADDERALL) 30 MG tablet    Sig: Take 1 tablet by mouth 2 (two) times daily.    Dispense:  60 tablet    Refill:  0    Fill on or after 03/11/2021    Order Specific Question:   Supervising Provider    Answer:   Beatrice Lecher D [[id number]]   gabapentin (NEURONTIN) 300 MG capsule    Sig: Take 1 to 2 capsules po QHS prn    Dispense:  60 capsule     Refill:  3    Order Specific Question:   Supervising Provider    Answer:   Beatrice Lecher D [[id number]]     Follow-up: Return in about 3 months (around 05/13/2021) for health maintenance exam, ADD.    Ronnell Freshwater, NP

## 2021-02-14 ENCOUNTER — Ambulatory Visit: Payer: BC Managed Care – PPO | Admitting: Nurse Practitioner

## 2021-02-17 ENCOUNTER — Telehealth: Payer: Self-pay | Admitting: Nurse Practitioner

## 2021-02-17 NOTE — Telephone Encounter (Signed)
Patient states she was stung by a bee on her index finger Saturday and that she has been feeling sick since then. She states her finger is red and swollen. Pt states she has taken antihistamines and iced the finger but that symptoms are worsening.   Advised patient PCP out of office today and we do not have any apts available. Suggested patient go to UC for evaluation and treatment today. Pt verbalized understanding and was agreeable. AS, CMA

## 2021-02-19 DIAGNOSIS — E663 Overweight: Secondary | ICD-10-CM | POA: Insufficient documentation

## 2021-05-16 ENCOUNTER — Encounter: Payer: BC Managed Care – PPO | Admitting: Nurse Practitioner

## 2022-02-17 ENCOUNTER — Encounter: Payer: Self-pay | Admitting: Nurse Practitioner

## 2022-02-17 ENCOUNTER — Ambulatory Visit (INDEPENDENT_AMBULATORY_CARE_PROVIDER_SITE_OTHER): Payer: BC Managed Care – PPO | Admitting: Nurse Practitioner

## 2022-02-17 VITALS — Wt 135.0 lb

## 2022-02-17 DIAGNOSIS — U071 COVID-19: Secondary | ICD-10-CM | POA: Insufficient documentation

## 2022-02-17 DIAGNOSIS — J0140 Acute pansinusitis, unspecified: Secondary | ICD-10-CM | POA: Diagnosis not present

## 2022-02-17 DIAGNOSIS — J069 Acute upper respiratory infection, unspecified: Secondary | ICD-10-CM | POA: Insufficient documentation

## 2022-02-17 MED ORDER — METHYLPREDNISOLONE 4 MG PO TBPK: ORAL_TABLET | ORAL | 0 refills | Status: AC

## 2022-02-17 MED ORDER — AZITHROMYCIN 250 MG PO TABS: ORAL_TABLET | ORAL | 0 refills | Status: AC

## 2022-02-17 NOTE — Progress Notes (Signed)
Virtual Visit via Telephone Note  I connected with Dawn Spencer on 02/17/22 at 11:10 AM EDT by telephone and verified that I am speaking with the correct person using two identifiers.  Location: Patient: home Provider: [place] [facility] at [facility]     I discussed the limitations, risks, security and privacy concerns of performing an evaluation and management service by telephone and the availability of in person appointments. I also discussed with the patient that there may be a patient responsible charge related to this service. The patient expressed understanding and agreed to proceed.   History of Present Illness: Acute illness -started feeling bad with sore throat and headache on Wednesday 02/11/2022 -positive COVID 19 test on Thursday 02/12/2022.  -has developed swelling in maxillary sinus region with redness -eyes swollen and draining - excess tears and purulent fluid.  -sinus pressure and nasal congestions -continues to have sore throat and headache  -on and off low grade fever, generally around 100 orally  -denies nausea, vomiting, or diarrhea   Observations/Objective:  The patient is alert and oriented. She is pleasant and answers all questions appropriately. Breathing is non-labored. She is in no acute distress at this time.  She is nasally congested. She sounds as though she is moderately ill.   Today's Vitals   02/17/22 1102  Weight: 135 lb (61.2 kg)   Body mass index is 26.37 kg/m.   Assessment and Plan: 1. Acute non-recurrent pansinusitis Start z-pack. Take as directed for 5 days. Rest and increase fluids. Continue using OTC medication to control symptoms.   - azithromycin (ZITHROMAX) 250 MG tablet; z-pack - take as directed for 5 days  Dispense: 6 tablet; Refill: 0  2. Upper respiratory tract infection due to COVID-19 virus Add medrol taper. Take as directed for 6 days. Recommend she take OTC zinc, vitamin d, and vitamin c every day. Can also use  otc medications as needed and as indicated for symptom management   - methylPREDNISolone (MEDROL) 4 MG TBPK tablet; Take by mouth as directed for 6 days  Dispense: 21 tablet; Refill: 0   Follow Up Instructions:    I discussed the assessment and treatment plan with the patient. The patient was provided an opportunity to ask questions and all were answered. The patient agreed with the plan and demonstrated an understanding of the instructions.   The patient was advised to call back or seek an in-person evaluation if the symptoms worsen or if the condition fails to improve as anticipated.  I provided 10 minutes of non-face-to-face time during this encounter.   Ronnell Freshwater, NP

## 2022-03-03 ENCOUNTER — Other Ambulatory Visit: Payer: Self-pay | Admitting: Nurse Practitioner

## 2022-03-03 DIAGNOSIS — G43911 Migraine, unspecified, intractable, with status migrainosus: Secondary | ICD-10-CM

## 2022-03-03 DIAGNOSIS — G2581 Restless legs syndrome: Secondary | ICD-10-CM

## 2023-12-30 ENCOUNTER — Telehealth: Admitting: Physician Assistant

## 2023-12-30 ENCOUNTER — Ambulatory Visit: Payer: Self-pay

## 2023-12-30 DIAGNOSIS — G43009 Migraine without aura, not intractable, without status migrainosus: Secondary | ICD-10-CM | POA: Diagnosis not present

## 2023-12-30 MED ORDER — RIZATRIPTAN BENZOATE 10 MG PO TABS: 10.0000 mg | ORAL_TABLET | ORAL | 0 refills | Status: AC | PRN

## 2023-12-30 MED ORDER — ONDANSETRON 4 MG PO TBDP: 4.0000 mg | ORAL_TABLET | Freq: Three times a day (TID) | ORAL | 0 refills | Status: AC | PRN

## 2023-12-30 NOTE — Progress Notes (Signed)
 Virtual Visit Consent   Dawn Spencer, you are scheduled for a virtual visit with a [facility] provider today. Just as with appointments in the office, your consent must be obtained to participate. Your consent will be active for this visit and any virtual visit you may have with one of our providers in the next 365 days. If you have a MyChart account, a copy of this consent can be sent to you electronically.  As this is a virtual visit, video technology does not allow for your provider to perform a traditional examination. This may limit your provider's ability to fully assess your condition. If your provider identifies any concerns that need to be evaluated in person or the need to arrange testing (such as labs, EKG, etc.), we will make arrangements to do so. Although advances in technology are sophisticated, we cannot ensure that it will always work on either your end or our end. If the connection with a video visit is poor, the visit may have to be switched to a telephone visit. With either a video or telephone visit, we are not always able to ensure that we have a secure connection.  By engaging in this virtual visit, you consent to the provision of healthcare and authorize for your insurance to be billed (if applicable) for the services provided during this visit. Depending on your insurance coverage, you may receive a charge related to this service.  I need to obtain your verbal consent now. Are you willing to proceed with your visit today? Dawn Spencer has provided verbal consent on 12/30/2023 for a virtual visit (video or telephone). Dawn CHRISTELLA Dickinson, PA-C  Date: 12/30/2023 2:15 PM   Virtual Visit via Video Note   I, Dawn Spencer, connected with  Dawn Spencer  ([id number], 11-30-1965) on 12/30/23 at  2:00 PM EDT by a video-enabled telemedicine application and verified that I am speaking with the correct person using two identifiers.  Location: Patient: Virtual Visit  Location Patient: Home Provider: Virtual Visit Location Provider: Home Office   I discussed the limitations of evaluation and management by telemedicine and the availability of in person appointments. The patient expressed understanding and agreed to proceed.    History of Present Illness: Dawn Spencer is a 58 y.o. who identifies as a female who was assigned female at birth, and is being seen today for migraine.  HPI: Migraine  This is a new problem. The current episode started in the past 7 days (2 days). The problem occurs constantly. The problem has been unchanged. The pain is located in the Retro-orbital and left unilateral region. The pain does not radiate. The pain quality is similar to prior headaches. The quality of the pain is described as aching. The pain is moderate. Associated symptoms include nausea, phonophobia, photophobia and vomiting. Pertinent negatives include no back pain, blurred vision, ear pain, eye pain, eye redness, fever, hearing loss, scalp tenderness, sinus pressure or visual change. Nothing aggravates the symptoms. She has tried Excedrin and NSAIDs (and husband's BP medication) for the symptoms. The treatment provided no relief. Her past medical history is significant for migraine headaches. There is no history of hypertension.     Problems:  Patient Active Problem List   Diagnosis Date Noted   Upper respiratory tract infection due to COVID-19 virus 02/17/2022   Overweight with body mass index (BMI) 25.0-29.9 02/19/2021   Body mass index 28.0-28.9, adult 11/20/2020   Acute non-recurrent pansinusitis 09/19/2020   Encounter to establish  care 08/11/2020   Migraine with status migrainosus, not intractable 08/11/2020   BMI 32.0-32.9,adult 08/11/2020   Vitamin D  deficiency 05/10/2020   Abnormal weight gain 04/27/2020   Encounter for long-term (current) use of high-risk medication 04/27/2020   Encounter for general adult medical examination with abnormal findings  01/24/2020   Needs flu shot 01/24/2020   Dysuria 01/24/2020   Other fatigue 10/01/2019   Restless legs syndrome 10/01/2019   Screening for colon cancer 10/01/2019   Encounter for screening mammogram for malignant neoplasm of breast 10/01/2019   Acute medial meniscus tear of right knee 08/04/2017   Attention and concentration deficit 07/14/2016   Granulation tissue at vaginal vault 01/10/2015   Intractable migraine with status migrainosus 06/27/2013   Migraine without aura 06/02/2011   Insomnia 06/02/2011    Allergies: No Known Allergies Medications:  Current Outpatient Medications:    ondansetron  (ZOFRAN -ODT) 4 MG disintegrating tablet, Take 1 tablet (4 mg total) by mouth every 8 (eight) hours as needed., Disp: 20 tablet, Rfl: 0   rizatriptan  (MAXALT ) 10 MG tablet, Take 1 tablet (10 mg total) by mouth as needed for migraine. May repeat in 2 hours if needed, Disp: 10 tablet, Rfl: 0   amphetamine -dextroamphetamine  (ADDERALL) 30 MG tablet, Take 1 tablet by mouth 2 (two) times daily., Disp: 60 tablet, Rfl: 0   azithromycin  (ZITHROMAX ) 250 MG tablet, z-pack - take as directed for 5 days, Disp: 6 tablet, Rfl: 0   cetirizine (ZYRTEC) 10 MG tablet, Take 10 mg by mouth daily., Disp: , Rfl:    Cyanocobalamin (B-12) 5000 MCG CAPS, Take by mouth., Disp: , Rfl:    ergocalciferol  (DRISDOL ) 1.25 MG (50000 UT) capsule, Take 1 capsule (50,000 Units total) by mouth once a week. (Patient not taking: Reported on 11/05/2020), Disp: 4 capsule, Rfl: 5   FIBER PO, Take 1 capsule by mouth daily. Fiber Well 5 g Probiotic, Disp: , Rfl:    gabapentin  (NEURONTIN ) 300 MG capsule, TAKE 1 TO 2 CAPSULES BY MOUTH AT BEDTIME AS NEEDED, Disp: 60 capsule, Rfl: 3   ibuprofen  (ADVIL ,MOTRIN ) 200 MG tablet, Take 600-800 mg by mouth every 6 (six) hours as needed for headache or moderate pain., Disp: , Rfl:    methylPREDNISolone  (MEDROL ) 4 MG TBPK tablet, Take by mouth as directed for 6 days, Disp: 21 tablet, Rfl: 0   promethazine   (PHENERGAN ) 25 MG tablet, To take po PRN during migraine headaches, Disp: 30 tablet, Rfl: 2  Observations/Objective: Patient is well-developed, well-nourished in no acute distress.  Resting comfortably at home.  Head is normocephalic, atraumatic.  No labored breathing.  Speech is clear and coherent with logical content.  Patient is alert and oriented at baseline.    Assessment and Plan: 1. Migraine without aura and without status migrainosus, not intractable (Primary) - rizatriptan  (MAXALT ) 10 MG tablet; Take 1 tablet (10 mg total) by mouth as needed for migraine. May repeat in 2 hours if needed  Dispense: 10 tablet; Refill: 0 - ondansetron  (ZOFRAN -ODT) 4 MG disintegrating tablet; Take 1 tablet (4 mg total) by mouth every 8 (eight) hours as needed.  Dispense: 20 tablet; Refill: 0  - Has migraine history but occurrence had decreased enough to where she has not needed preventative therapy and was using just OTC medications, however, they are not effective with this episode - Maxalt  refilled as this has been successful - Zofran  added for nausea - Push fluids - Rest as needed - Seek in person evaluation if not improving or symptoms worsen  Follow  Up Instructions: I discussed the assessment and treatment plan with the patient. The patient was provided an opportunity to ask questions and all were answered. The patient agreed with the plan and demonstrated an understanding of the instructions.  A copy of instructions were sent to the patient via MyChart unless otherwise noted below.    The patient was advised to call back or seek an in-person evaluation if the symptoms worsen or if the condition fails to improve as anticipated.    Dawn CHRISTELLA Dickinson, PA-C

## 2023-12-30 NOTE — Telephone Encounter (Signed)
 FYI Only or Action Required?: FYI only for provider.  Patient was last seen in primary care on 02/17/2022 by Hanford Powell BRAVO, NP.  Called Nurse Triage reporting Headache.  Symptoms began 2 days ago.  Interventions attempted: OTC medications: Excedrin Migraine and Ibuprofen .  Symptoms are: unchanged.  Triage Disposition: See PCP Within 2 Weeks  Patient/caregiver understands and will follow disposition?: Yes       Copied from CRM [id number]. Topic: Clinical - Red Word Triage >> Dec 30, 2023 11:19 AM Carlyon D wrote: Red Word that prompted transfer to Nurse Triage: severe migraines and headaches, vomiting 2 days         Reason for Disposition  Headache is a chronic symptom (recurrent or ongoing AND present > 4 weeks)  Answer Assessment - Initial Assessment Questions Patient reports she has moved and would like to transfer to another office. Patient transferred to assist with [facility] appointment and have advised to go to urgent care if there are no appointments within the advised time frame.      1. LOCATION: Where does it hurt?      Frontal behind left eye  2. ONSET: When did the headache start? (e.g., minutes, hours, days)      2 days ago  3. PATTERN: Does the pain come and go, or has it been constant since it started?     Constant  4. SEVERITY: How bad is the pain? and What does it keep you from doing?  (e.g., Scale 1-10; mild, moderate, or severe)     Waxing and waning, 7/10 currently  5. RECURRENT SYMPTOM: Have you ever had headaches before? If Yes, ask: When was the last time? and What happened that time?      Yes, history of migraines  6. CAUSE: What do you think is causing the headache?     Migraine headache  7. MIGRAINE: Have you been diagnosed with migraine headaches? If Yes, ask: Is this headache similar?      Yes 8. HEAD INJURY: Has there been any recent injury to your head?      No 9. OTHER SYMPTOMS: Do you have any other symptoms?  (e.g., fever, stiff neck, eye pain, sore throat, cold symptoms)     Vomiting  Protocols used: Headache-A-AH

## 2023-12-30 NOTE — Patient Instructions (Signed)
 Suzen KATHEE Friend, thank you for joining Delon CHRISTELLA Dickinson, PA-C for today's virtual visit.  While this provider is not your primary care provider (PCP), if your PCP is located in our provider database this encounter information will be shared with them immediately following your visit.   A [facility] MyChart account gives you access to today's visit and all your visits, tests, and labs performed at [facility]  click here if you don't have a [facility] MyChart account or go to [email or web address]  Consent: (Patient) Dawn Spencer provided verbal consent for this virtual visit at the beginning of the encounter.  Current Medications:  Current Outpatient Medications:    ondansetron  (ZOFRAN -ODT) 4 MG disintegrating tablet, Take 1 tablet (4 mg total) by mouth every 8 (eight) hours as needed., Disp: 20 tablet, Rfl: 0   rizatriptan  (MAXALT ) 10 MG tablet, Take 1 tablet (10 mg total) by mouth as needed for migraine. May repeat in 2 hours if needed, Disp: 10 tablet, Rfl: 0   amphetamine -dextroamphetamine  (ADDERALL) 30 MG tablet, Take 1 tablet by mouth 2 (two) times daily., Disp: 60 tablet, Rfl: 0   azithromycin  (ZITHROMAX ) 250 MG tablet, z-pack - take as directed for 5 days, Disp: 6 tablet, Rfl: 0   cetirizine (ZYRTEC) 10 MG tablet, Take 10 mg by mouth daily., Disp: , Rfl:    Cyanocobalamin (B-12) 5000 MCG CAPS, Take by mouth., Disp: , Rfl:    ergocalciferol  (DRISDOL ) 1.25 MG (50000 UT) capsule, Take 1 capsule (50,000 Units total) by mouth once a week. (Patient not taking: Reported on 11/05/2020), Disp: 4 capsule, Rfl: 5   FIBER PO, Take 1 capsule by mouth daily. Fiber Well 5 g Probiotic, Disp: , Rfl:    gabapentin  (NEURONTIN ) 300 MG capsule, TAKE 1 TO 2 CAPSULES BY MOUTH AT BEDTIME AS NEEDED, Disp: 60 capsule, Rfl: 3   ibuprofen  (ADVIL ,MOTRIN ) 200 MG tablet, Take 600-800 mg by mouth every 6 (six) hours as needed for headache or moderate pain., Disp: , Rfl:    methylPREDNISolone   (MEDROL ) 4 MG TBPK tablet, Take by mouth as directed for 6 days, Disp: 21 tablet, Rfl: 0   promethazine  (PHENERGAN ) 25 MG tablet, To take po PRN during migraine headaches, Disp: 30 tablet, Rfl: 2   Medications ordered in this encounter:  Meds ordered this encounter  Medications   rizatriptan  (MAXALT ) 10 MG tablet    Sig: Take 1 tablet (10 mg total) by mouth as needed for migraine. May repeat in 2 hours if needed    Dispense:  10 tablet    Refill:  0    Supervising Provider:   LAMPTEY, PHILIP O [[id number]]   ondansetron  (ZOFRAN -ODT) 4 MG disintegrating tablet    Sig: Take 1 tablet (4 mg total) by mouth every 8 (eight) hours as needed.    Dispense:  20 tablet    Refill:  0    Supervising Provider:   LAMPTEY, PHILIP O [[id number]]     *If you need refills on other medications prior to your next appointment, please contact your pharmacy*  Follow-Up: Call back or seek an in-person evaluation if the symptoms worsen or if the condition fails to improve as anticipated.   Virtual Care [phone]  Other Instructions  Migraine Headache A migraine headache is an intense pulsing or throbbing pain on one or both sides of the head. Migraine headaches may also cause other symptoms, such as nausea, vomiting, and sensitivity to light and noise. A migraine headache can last from 4  hours to 3 days. Talk with your health care provider about what things may bring on (trigger) your migraine headaches. What are the causes? The exact cause is not known. However, a migraine may be caused when nerves in the brain get irritated and release chemicals that cause blood vessels to become inflamed. This inflammation causes pain. Migraines may be triggered or caused by: Smoking. Medicines, such as: Nitroglycerin, which is used to treat chest pain. Birth control pills. Estrogen. Certain blood pressure medicines. Foods or drinks that contain nitrates, glutamate, aspartame, MSG, or tyramine. Certain foods  or drinks, such as aged cheeses, chocolate, alcohol, or caffeine. Doing physical activity that is very hard. Other triggers may include: Menstruation. Pregnancy. Hunger. Stress. Getting too much or too little sleep. Weather changes. Tiredness (fatigue). What increases the risk? The following factors may make you more likely to have migraine headaches: Being between the ages of 46-68 years old. Being female. Having a family history of migraine headaches. Being Caucasian. Having a mental health condition, such as depression or anxiety. Being obese. What are the signs or symptoms? The main symptom of this condition is pulsing or throbbing pain. This pain may: Happen in any area of the head, such as on one or both sides. Make it hard to do daily activities. Get worse with physical activity. Get worse around bright lights, loud noises, or smells. Other symptoms may include: Nausea. Vomiting. Dizziness. Before a migraine headache starts, you may get warning signs (an aura). An aura may include: Seeing flashing lights or having blind spots. Seeing bright spots, halos, or zigzag lines. Having tunnel vision or blurred vision. Having numbness or a tingling feeling. Having trouble talking. Having muscle weakness. After a migraine ends, you may have symptoms. These may include: Feeling tired. Trouble concentrating. How is this diagnosed? A migraine headache can be diagnosed based on: Your symptoms. A physical exam. Tests, such as: A CT scan or an MRI of the head. These tests can help rule out other causes of headaches. Taking fluid from the spine (lumbar puncture) to examine it (cerebrospinal fluid analysis, or CSF analysis). How is this treated? This condition may be treated with medicines that: Relieve pain and nausea. Prevent migraines. Treatment may also include: Acupuncture. Lifestyle changes like avoiding foods that trigger migraine headaches. Learning ways to control  your body (biofeedback). Talk therapy to help you know and deal with negative thoughts (cognitive behavioral therapy). Follow these instructions at home: Medicines Take over-the-counter and prescription medicines only as told by your provider. Ask your provider if the medicine prescribed to you: Requires you to avoid driving or using machinery. Can cause constipation. You may need to take these actions to prevent or treat constipation: Drink enough fluid to keep your pee (urine) pale yellow. Take over-the-counter or prescription medicines. Eat foods that are high in fiber, such as beans, whole grains, and fresh fruits and vegetables. Limit foods that are high in fat and processed sugars, such as fried or sweet foods. Lifestyle  Do not drink alcohol. Do not use any products that contain nicotine or tobacco. These products include cigarettes, chewing tobacco, and vaping devices, such as e-cigarettes. If you need help quitting, ask your provider. Get 7-9 hours of sleep each night, or the amount recommended by your provider. Find ways to manage stress, such as meditation, deep breathing, or yoga. Try to exercise regularly. This can help lessen how bad and how often your migraines occur. General instructions Keep a journal to find out what triggers  your migraines, so you can avoid those things. For example, write down: What you eat and drink. How much sleep you get. Any change to your diet or medicines. If you have a migraine headache: Avoid things that make your symptoms worse, such as bright lights. Lie down in a dark, quiet room. Do not drive or use machinery. Ask your provider what activities are safe for you while you have symptoms. Keep all follow-up visits. Your provider will monitor your symptoms and recommend any further treatment. Where to find more information Coalition for Headache and Migraine Patients (CHAMP): headachemigraine.org American Migraine Foundation:  americanmigrainefoundation.org National Headache Foundation: headaches.org Contact a health care provider if: You have symptoms that are different or worse than your usual migraine headache symptoms. You have more than 15 days of headaches in one month. Get help right away if: Your migraine headache becomes severe or lasts more than 72 hours. You have a fever or stiff neck. You have vision loss. Your muscles feel weak or like you cannot control them. You lose your balance often or have trouble walking. You faint. You have a seizure. This information is not intended to replace advice given to you by your health care provider. Make sure you discuss any questions you have with your health care provider. Document Revised: 12/01/2021 Document Reviewed: 12/01/2021 Elsevier Patient Education  2024 Elsevier Inc.   If you have been instructed to have an in-person evaluation today at a local Urgent Care facility, please use the link below. It will take you to a list of all of our available [facility], including address, phone number and hours of operation. Please do not delay care.  [facility]  If you or a family member do not have a primary care provider, use the link below to schedule a visit and establish care. When you choose a [facility] primary care physician or advanced practice provider, you gain a long-term partner in health. Find a Primary Care Provider  Learn more about [facility] in-office and virtual care options:  - Get Care Now
